# Patient Record
Sex: Male | Born: 1945 | Race: White | Hispanic: No | Smoking: Never smoker
Health system: Southern US, Community
[De-identification: ages and names within clinical notes are randomized; demographics above are authoritative.]

## PROBLEM LIST (undated history)

## (undated) HISTORY — PX: KNEE ARTHROSCOPY: SHX127

---

## 2007-07-25 ENCOUNTER — Ambulatory Visit: Payer: Self-pay | Admitting: Cardiology

## 2009-08-20 ENCOUNTER — Ambulatory Visit (HOSPITAL_COMMUNITY): Admission: RE | Admit: 2009-08-20 | Discharge: 2009-08-20 | Payer: Self-pay | Admitting: Urology

## 2009-08-24 ENCOUNTER — Emergency Department (HOSPITAL_COMMUNITY): Admission: EM | Admit: 2009-08-24 | Discharge: 2009-08-24 | Payer: Self-pay | Admitting: Emergency Medicine

## 2009-08-26 ENCOUNTER — Ambulatory Visit (HOSPITAL_BASED_OUTPATIENT_CLINIC_OR_DEPARTMENT_OTHER): Admission: RE | Admit: 2009-08-26 | Discharge: 2009-08-26 | Payer: Self-pay | Admitting: Urology

## 2009-08-26 ENCOUNTER — Emergency Department (HOSPITAL_COMMUNITY): Admission: EM | Admit: 2009-08-26 | Discharge: 2009-08-26 | Payer: Self-pay | Admitting: Emergency Medicine

## 2010-08-04 LAB — URINALYSIS, ROUTINE W REFLEX MICROSCOPIC
Bilirubin Urine: NEGATIVE
Glucose, UA: NEGATIVE mg/dL
Ketones, ur: NEGATIVE mg/dL
Urobilinogen, UA: 0.2 mg/dL (ref 0.0–1.0)

## 2010-08-04 LAB — POCT I-STAT, CHEM 8
Calcium, Ion: 1.06 mmol/L — ABNORMAL LOW (ref 1.12–1.32)
Glucose, Bld: 116 mg/dL — ABNORMAL HIGH (ref 70–99)
HCT: 47 % (ref 39.0–52.0)
Hemoglobin: 16 g/dL (ref 13.0–17.0)
TCO2: 27 mmol/L (ref 0–100)

## 2010-08-04 LAB — URINE MICROSCOPIC-ADD ON

## 2010-09-16 ENCOUNTER — Emergency Department (HOSPITAL_COMMUNITY): Admission: EM | Admit: 2010-09-16 | Payer: Self-pay | Source: Home / Self Care

## 2011-12-26 DIAGNOSIS — Z23 Encounter for immunization: Secondary | ICD-10-CM | POA: Diagnosis not present

## 2012-02-01 DIAGNOSIS — D239 Other benign neoplasm of skin, unspecified: Secondary | ICD-10-CM | POA: Diagnosis not present

## 2012-02-01 DIAGNOSIS — L821 Other seborrheic keratosis: Secondary | ICD-10-CM | POA: Diagnosis not present

## 2012-02-01 DIAGNOSIS — L570 Actinic keratosis: Secondary | ICD-10-CM | POA: Diagnosis not present

## 2012-03-02 DIAGNOSIS — Z23 Encounter for immunization: Secondary | ICD-10-CM | POA: Diagnosis not present

## 2012-05-23 DIAGNOSIS — L570 Actinic keratosis: Secondary | ICD-10-CM | POA: Diagnosis not present

## 2012-08-08 DIAGNOSIS — L570 Actinic keratosis: Secondary | ICD-10-CM | POA: Diagnosis not present

## 2012-09-13 DIAGNOSIS — H5310 Unspecified subjective visual disturbances: Secondary | ICD-10-CM | POA: Diagnosis not present

## 2012-09-13 DIAGNOSIS — H43819 Vitreous degeneration, unspecified eye: Secondary | ICD-10-CM | POA: Diagnosis not present

## 2012-09-13 DIAGNOSIS — H259 Unspecified age-related cataract: Secondary | ICD-10-CM | POA: Diagnosis not present

## 2012-09-13 DIAGNOSIS — H5210 Myopia, unspecified eye: Secondary | ICD-10-CM | POA: Diagnosis not present

## 2013-02-19 DIAGNOSIS — Z23 Encounter for immunization: Secondary | ICD-10-CM | POA: Diagnosis not present

## 2013-02-22 DIAGNOSIS — G4700 Insomnia, unspecified: Secondary | ICD-10-CM | POA: Diagnosis not present

## 2013-02-22 DIAGNOSIS — F411 Generalized anxiety disorder: Secondary | ICD-10-CM | POA: Diagnosis not present

## 2013-02-22 DIAGNOSIS — Z0000 Encounter for general adult medical examination without abnormal findings: Secondary | ICD-10-CM | POA: Diagnosis not present

## 2013-02-22 DIAGNOSIS — Z1331 Encounter for screening for depression: Secondary | ICD-10-CM | POA: Diagnosis not present

## 2013-02-22 DIAGNOSIS — K3189 Other diseases of stomach and duodenum: Secondary | ICD-10-CM | POA: Diagnosis not present

## 2013-02-22 DIAGNOSIS — Z23 Encounter for immunization: Secondary | ICD-10-CM | POA: Diagnosis not present

## 2013-02-22 DIAGNOSIS — N400 Enlarged prostate without lower urinary tract symptoms: Secondary | ICD-10-CM | POA: Diagnosis not present

## 2013-03-13 DIAGNOSIS — R5381 Other malaise: Secondary | ICD-10-CM | POA: Diagnosis not present

## 2013-04-03 ENCOUNTER — Encounter (INDEPENDENT_AMBULATORY_CARE_PROVIDER_SITE_OTHER): Payer: Self-pay | Admitting: *Deleted

## 2013-04-17 ENCOUNTER — Encounter (INDEPENDENT_AMBULATORY_CARE_PROVIDER_SITE_OTHER): Payer: Self-pay | Admitting: Internal Medicine

## 2013-04-17 ENCOUNTER — Ambulatory Visit (INDEPENDENT_AMBULATORY_CARE_PROVIDER_SITE_OTHER): Payer: Medicare Other | Admitting: Internal Medicine

## 2013-04-17 ENCOUNTER — Other Ambulatory Visit (INDEPENDENT_AMBULATORY_CARE_PROVIDER_SITE_OTHER): Payer: Self-pay | Admitting: *Deleted

## 2013-04-17 ENCOUNTER — Encounter (INDEPENDENT_AMBULATORY_CARE_PROVIDER_SITE_OTHER): Payer: Self-pay | Admitting: *Deleted

## 2013-04-17 VITALS — BP 118/62 | HR 70 | Temp 98.0°F | Ht 70.0 in | Wt 167.8 lb

## 2013-04-17 DIAGNOSIS — K219 Gastro-esophageal reflux disease without esophagitis: Secondary | ICD-10-CM | POA: Diagnosis not present

## 2013-04-17 NOTE — Patient Instructions (Signed)
EGD with Dr. Karilyn Cota, The risks and benefits such as perforation, bleeding, and infection were reviewed with the patient and is agreeable.

## 2013-04-17 NOTE — Progress Notes (Signed)
Subjective:     Patient ID: Shawn Bullock, male   DOB: 12-10-1945, 67 y.o.   MRN: [id number]  HPI Referred to our our office by Dr. Neita Carp for uncontrolled GERD. He tells me about 25 yrs ago he says he had an ulcer diagnosed on an EGD in [place] by ? Dr Jarold Motto.- Around the 1st of September he started having a gnawing feeling in his epigastric region. He started taking prilosec. He also added the antacid. His symptoms are better. He still has the symptoms. It does not occur when he is sleeping. Occurred just before lunch today. He has more belching. Appetite is good. No weight loss. No dysphagia. No abdominal pain, just an emptying feeling. BMs are regular. No melena or bright rectal bleeding.   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  History reviewed. No pertinent past medical history.\ Past Surgical History  Procedure Laterality Date  . Knee arthroscopy      8-9 yrs ago   No Known Allergies History reviewed. No pertinent past medical history.     Objective:   Physical Exam  Filed Vitals:   04/17/13 1444  BP: 118/62  Pulse: 70  Temp: 98 F (36.7 C)  Height: 5\' 10"  (1.778 m)  Weight: 167 lb 12.8 oz (76.114 kg)   Alert and oriented. Skin warm and dry. Oral mucosa is moist.   . Sclera anicteric, conjunctivae is pink. Thyroid not enlarged. No cervical lymphadenopathy. Lungs clear. Heart regular rate and rhythm.  Abdomen is soft. Bowel sounds are positive. No hepatomegaly. No abdominal masses felt. Slight tenderness to epigastric region.  No edema to lower extremities.       Assessment:    Epigastric tenderness. Genella Rife. He has a gnawing sensation in his epigastric region. PUD needs to be ruled out.     Plan:    EGD with Dr.[name]. The risks and benefits such as perforation, bleeding, and infection were reviewed with the patient and is agreeable.   Dexilant samples x 3 boxes given to patient.

## 2013-04-22 DIAGNOSIS — L988 Other specified disorders of the skin and subcutaneous tissue: Secondary | ICD-10-CM | POA: Diagnosis not present

## 2013-04-22 DIAGNOSIS — L570 Actinic keratosis: Secondary | ICD-10-CM | POA: Diagnosis not present

## 2013-04-22 DIAGNOSIS — D485 Neoplasm of uncertain behavior of skin: Secondary | ICD-10-CM | POA: Diagnosis not present

## 2013-04-24 ENCOUNTER — Encounter (HOSPITAL_COMMUNITY): Payer: Self-pay | Admitting: Pharmacy Technician

## 2013-05-03 ENCOUNTER — Encounter (HOSPITAL_COMMUNITY): Payer: Self-pay | Admitting: *Deleted

## 2013-05-03 ENCOUNTER — Encounter (HOSPITAL_COMMUNITY)
Admission: RE | Disposition: A | Discharge status: Home / Self Care | Payer: Self-pay | Source: Ambulatory Visit | Attending: Internal Medicine

## 2013-05-03 ENCOUNTER — Ambulatory Visit (HOSPITAL_COMMUNITY)
Admission: RE | Admit: 2013-05-03 | Discharge: 2013-05-03 | Disposition: A | Discharge status: Home / Self Care | Payer: Medicare Other | Source: Ambulatory Visit | Attending: Internal Medicine | Admitting: Internal Medicine

## 2013-05-03 DIAGNOSIS — Z8711 Personal history of peptic ulcer disease: Secondary | ICD-10-CM

## 2013-05-03 DIAGNOSIS — R12 Heartburn: Secondary | ICD-10-CM

## 2013-05-03 DIAGNOSIS — K2270 Barrett's esophagus without dysplasia: Secondary | ICD-10-CM | POA: Insufficient documentation

## 2013-05-03 DIAGNOSIS — K449 Diaphragmatic hernia without obstruction or gangrene: Secondary | ICD-10-CM | POA: Insufficient documentation

## 2013-05-03 DIAGNOSIS — R1013 Epigastric pain: Secondary | ICD-10-CM | POA: Insufficient documentation

## 2013-05-03 DIAGNOSIS — K219 Gastro-esophageal reflux disease without esophagitis: Secondary | ICD-10-CM

## 2013-05-03 HISTORY — PX: ESOPHAGOGASTRODUODENOSCOPY: SHX5428

## 2013-05-03 SURGERY — EGD (ESOPHAGOGASTRODUODENOSCOPY)
Anesthesia: Moderate Sedation

## 2013-05-03 MED ORDER — SUCRALFATE 1 G PO TABS: 1.0000 g | ORAL_TABLET | Freq: Every day | ORAL | Status: DC

## 2013-05-03 MED ORDER — MIDAZOLAM HCL 5 MG/5ML IJ SOLN
INTRAMUSCULAR | Status: DC | PRN
  Administered 2013-05-03 (×4): 2 mg via INTRAVENOUS

## 2013-05-03 MED ORDER — STERILE WATER FOR IRRIGATION IR SOLN
Status: DC | PRN
  Administered 2013-05-03: 15:00:00

## 2013-05-03 MED ORDER — SODIUM CHLORIDE 0.9 % IV SOLN
INTRAVENOUS | Status: DC
  Administered 2013-05-03: 1000 mL via INTRAVENOUS

## 2013-05-03 MED ORDER — MEPERIDINE HCL 50 MG/ML IJ SOLN
INTRAMUSCULAR | Status: DC | PRN
  Administered 2013-05-03 (×2): 25 mg

## 2013-05-03 MED ORDER — MIDAZOLAM HCL 5 MG/5ML IJ SOLN
INTRAMUSCULAR | Status: AC
  Filled 2013-05-03: qty 10

## 2013-05-03 MED ORDER — ESOMEPRAZOLE MAGNESIUM 40 MG PO CPDR: 40.0000 mg | DELAYED_RELEASE_CAPSULE | Freq: Every day | ORAL | Status: DC

## 2013-05-03 MED ORDER — MEPERIDINE HCL 50 MG/ML IJ SOLN
INTRAMUSCULAR | Status: AC
  Filled 2013-05-03: qty 1

## 2013-05-03 MED ORDER — BUTAMBEN-TETRACAINE-BENZOCAINE 2-2-14 % EX AERO
INHALATION_SPRAY | CUTANEOUS | Status: DC | PRN
  Administered 2013-05-03: 2 via TOPICAL

## 2013-05-03 NOTE — Op Note (Addendum)
EGD PROCEDURE REPORT  PATIENT:  Shawn Bullock  MR#:  [id number] Birthdate:  Jun 01, 1945, 67 y.o., male Endoscopist:  Dr. Malissa Hippo, MD Referred By:  Dr. Bonnetta Barry ref. provider found Procedure Date: 05/03/2013  Procedure:   EGD  Indications:  Patient is 67 year old Caucasian male who presents with a two-month history of intermittent epigastric pain. Pain started when he was under a lot of stress. He has chronic nocturnal regurgitation and heartburn. He denies nausea vomiting anorexia abdominal pain or melena. He has been with 2 different PPIs but does not feel better. He has remote history of peptic ulcer disease. Recent H. pylori serology was negative. He is undergoing diagnostic EGD.            Informed Consent:  The risks, benefits, alternatives & imponderables which include, but are not limited to, bleeding, infection, perforation, drug reaction and potential missed lesion have been reviewed.  The potential for biopsy, lesion removal, esophageal dilation, etc. have also been discussed.  Questions have been answered.  All parties agreeable.  Please see history & physical in medical record for more information.  Medications:  Demerol 50 mg IV Versed 8 mg IV Cetacaine spray topically for oropharyngeal anesthesia  Description of procedure:  The endoscope was introduced through the mouth and advanced to the second portion of the duodenum without difficulty or limitations. The mucosal surfaces were surveyed very carefully during advancement of the scope and upon withdrawal.  Findings:  Esophagus:  Mucosa of the esophagus was normal. The GE junction was wavy 2 small islands of gastric type mucosa above the GE junction. No ring or stricture noted. GEJ:  39 cm Hiatus:  41 cm Stomach:  Stomach was empty and distended very well with insufflation. Folds in the proximal stomach were normal. Examination of mucosa at gastric body, antrum, pyloric channel, angularis, fundus and cardia was  normal. Duodenum:  Normal bulbar and post bulbar mucosa.   Therapeutic/Diagnostic Maneuvers Performed:   Biopsy was taken from the GE junction to rule out short segment Barrett's.  Complications:  None  Impression: Small sliding hiatal hernia with wavy GE junction and two small islands of gastric type mucosa proximal to GE junction. Biopsy taken. No evidence of peptic ulcer disease.  Recommendations:  Nexium 40 mg by mouth daily. Sucralfate 2 g by mouth each bedtime. I will be contacting patient with results of biopsy and further recommendations.  [name],[name]  05/03/2013  3:59 PM  CC: Dr. Estanislado Pandy, MD & Dr. Bonnetta Barry ref. provider found

## 2013-05-03 NOTE — H&P (Signed)
Shawn Bullock is an 67 y.o. male.   Chief Complaint: Patient's here for EGD. HPI: Patient is a 67 year old Caucasian male who has remote history of peptic ulcer disease who presents with nausea and epigastric pain over the last several weeks. He took Prilosec for over a month without any relief. He feels Maalox maybe helping. He has nocturnal regurgitation sporadically. He denies dysphagia nausea vomiting melena or rectal bleeding. He does not take any NSAIDs. H. pylori serology was negative.  History reviewed. No pertinent past medical history.  Past Surgical History  Procedure Laterality Date  . Knee arthroscopy      8-9 yrs ago    History reviewed. No pertinent family history. Social History:  reports that he has never smoked. He does not have any smokeless tobacco history on file. He reports that he drinks alcohol. He reports that he does not use illicit drugs.  Allergies: No Known Allergies  Medications Prior to Admission  Medication Sig Dispense Refill  . alum & mag hydroxide-simeth (MAALOX/MYLANTA) 200-200-20 MG/5ML suspension Take by mouth every 6 (six) hours as needed for indigestion or heartburn.      . Flaxseed, Linseed, (FLAXSEED OIL PO) Take 1 capsule by mouth 2 (two) times daily.       . Multiple Vitamins-Minerals (MULTIVITAMIN PO) Take by mouth.      . Omega-3 Fatty Acids (FISH OIL) 1000 MG CPDR Take 2 capsules by mouth daily.       . Red Yeast Rice Extract (RED YEAST RICE PO) Take 1 capsule by mouth 2 (two) times daily.       Marland Kitchen dexlansoprazole (DEXILANT) 60 MG capsule Take 60 mg by mouth daily.        No results found for this or any previous visit (from the past 48 hour(s)). No results found.  ROS  Blood pressure 138/87, pulse 82, temperature 98 F (36.7 C), temperature source Oral, resp. rate 20, SpO2 98.00%. Physical Exam  Constitutional: He appears well-developed and well-nourished.  HENT:  Mouth/Throat: Oropharynx is clear and moist.  Eyes: Conjunctivae  are normal. No scleral icterus.  Neck: No thyromegaly present.  Cardiovascular: Normal rate, regular rhythm and normal heart sounds.   No murmur heard. Respiratory: Effort normal and breath sounds normal.  GI: Soft. He exhibits no distension and no mass. There is no tenderness.  Musculoskeletal: He exhibits no edema.  Lymphadenopathy:    He has no cervical adenopathy.  Neurological: He is alert.  Skin: Skin is warm and dry.     Assessment/Plan Epigastric pain unresponsive to PPI. Remote history of peptic ulcer disease. Diagnostic EGD.  [name],[name] 05/03/2013, 3:24 PM

## 2013-05-07 ENCOUNTER — Encounter (HOSPITAL_COMMUNITY): Payer: Self-pay | Admitting: Internal Medicine

## 2013-05-13 ENCOUNTER — Encounter (INDEPENDENT_AMBULATORY_CARE_PROVIDER_SITE_OTHER): Payer: Self-pay | Admitting: *Deleted

## 2013-05-13 ENCOUNTER — Other Ambulatory Visit (INDEPENDENT_AMBULATORY_CARE_PROVIDER_SITE_OTHER): Payer: Self-pay | Admitting: Internal Medicine

## 2013-05-13 ENCOUNTER — Telehealth (INDEPENDENT_AMBULATORY_CARE_PROVIDER_SITE_OTHER): Payer: Self-pay | Admitting: *Deleted

## 2013-05-13 DIAGNOSIS — Z0189 Encounter for other specified special examinations: Secondary | ICD-10-CM

## 2013-05-13 DIAGNOSIS — G8929 Other chronic pain: Secondary | ICD-10-CM

## 2013-05-13 NOTE — Telephone Encounter (Signed)
Patient is having CT A/P 05/17/13 and needs order for creatinine sent to [facility] lab

## 2013-05-14 DIAGNOSIS — Z0189 Encounter for other specified special examinations: Secondary | ICD-10-CM | POA: Diagnosis not present

## 2013-05-14 LAB — CREATININE, SERUM: Creat: 0.79 mg/dL (ref 0.50–1.35)

## 2013-05-14 NOTE — Telephone Encounter (Signed)
Lab order faxed to [facility].

## 2013-05-14 NOTE — Telephone Encounter (Signed)
Per Dr.[name] the patient will need to have labs drawn prior to diagnostic.

## 2013-05-15 ENCOUNTER — Encounter (INDEPENDENT_AMBULATORY_CARE_PROVIDER_SITE_OTHER): Payer: Self-pay | Admitting: Internal Medicine

## 2013-05-15 ENCOUNTER — Ambulatory Visit (HOSPITAL_COMMUNITY)
Admission: RE | Admit: 2013-05-15 | Discharge: 2013-05-15 | Disposition: A | Discharge status: Home / Self Care | Payer: Medicare Other | Source: Ambulatory Visit | Attending: Internal Medicine | Admitting: Internal Medicine

## 2013-05-15 DIAGNOSIS — G8929 Other chronic pain: Secondary | ICD-10-CM

## 2013-05-15 DIAGNOSIS — R1013 Epigastric pain: Secondary | ICD-10-CM | POA: Insufficient documentation

## 2013-05-15 MED ORDER — IOHEXOL 300 MG/ML  SOLN
100.0000 mL | Freq: Once | INTRAMUSCULAR | Status: AC | PRN
  Administered 2013-05-15: 100 mL via INTRAVENOUS

## 2013-05-17 ENCOUNTER — Telehealth (INDEPENDENT_AMBULATORY_CARE_PROVIDER_SITE_OTHER): Payer: Self-pay | Admitting: Internal Medicine

## 2013-05-17 ENCOUNTER — Other Ambulatory Visit (HOSPITAL_COMMUNITY): Payer: Medicare Other

## 2013-05-17 NOTE — Telephone Encounter (Signed)
Results given to patient. Dr. Laural Golden will call Saturday. I talked with Dr. Laural Golden before I called patient.

## 2013-05-19 ENCOUNTER — Encounter (INDEPENDENT_AMBULATORY_CARE_PROVIDER_SITE_OTHER): Payer: Self-pay | Admitting: Internal Medicine

## 2013-05-20 ENCOUNTER — Other Ambulatory Visit (INDEPENDENT_AMBULATORY_CARE_PROVIDER_SITE_OTHER): Payer: Self-pay | Admitting: Internal Medicine

## 2013-05-20 MED ORDER — HYOSCYAMINE SULFATE 0.125 MG SL SUBL: 0.1250 mg | SUBLINGUAL_TABLET | Freq: Four times a day (QID) | SUBLINGUAL | Status: DC | PRN

## 2013-06-18 ENCOUNTER — Encounter (INDEPENDENT_AMBULATORY_CARE_PROVIDER_SITE_OTHER): Payer: Self-pay | Admitting: Internal Medicine

## 2014-02-01 DIAGNOSIS — T1510XA Foreign body in conjunctival sac, unspecified eye, initial encounter: Secondary | ICD-10-CM | POA: Diagnosis not present

## 2014-02-21 IMAGING — CT CT ABD-PELV W/ CM
2 of 5 series · 16 of 46 positions shown, 18 images · IV contrast (Omnipaque 300)
Comparison: None.

CLINICAL DATA: Epigastric pain

EXAM:
CT ABDOMEN AND PELVIS WITH CONTRAST
TECHNIQUE: Multidetector CT imaging of the abdomen and pelvis was performed
using the standard protocol following bolus administration of
intravenous contrast.
CONTRAST:  100mL OMNIPAQUE IOHEXOL 300 MG/ML  SOLN

[Series 2: abd_pel_with 5.0 b40f · axial · 0.65mm/px · z∈[-511,-131]mm · 13 of 88 slices shown, 15 images]
[im 6/88  soft-tissue]
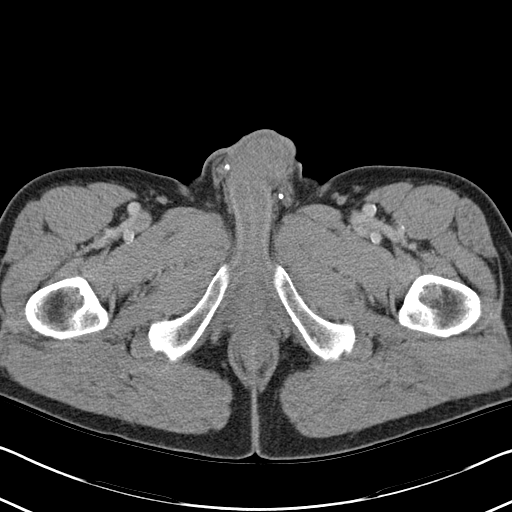
[im 6/88  bone]
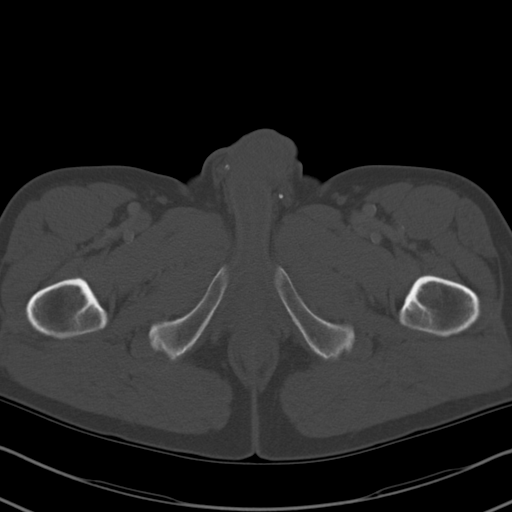
[im 11/88  soft-tissue]
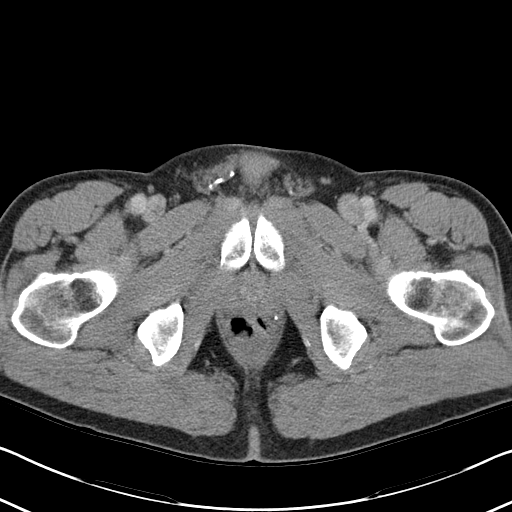
[im 21/88  soft-tissue]
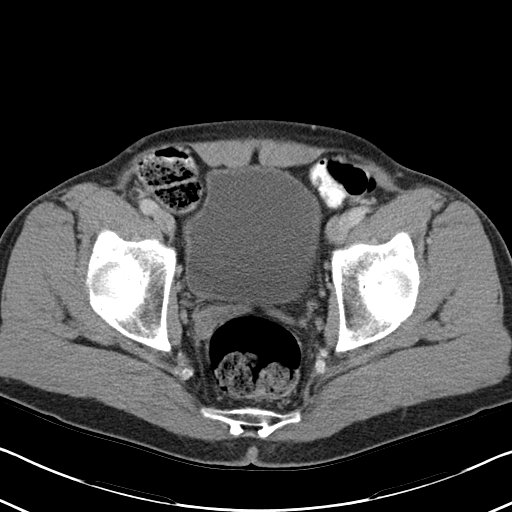
[im 26/88  soft-tissue]
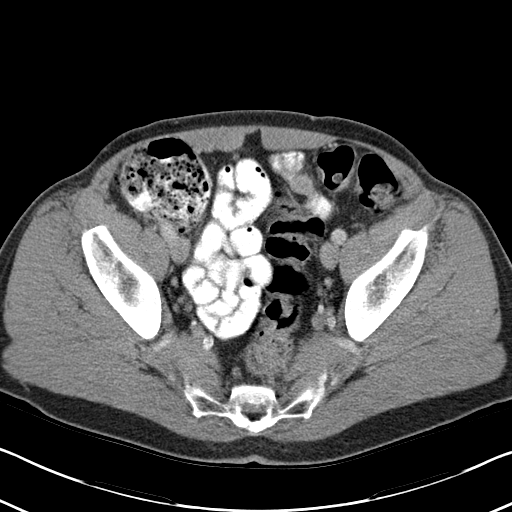
[im 31/88  soft-tissue]
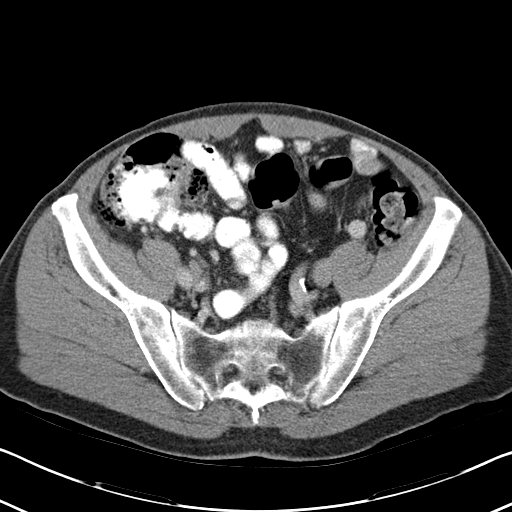
[im 36/88  soft-tissue]
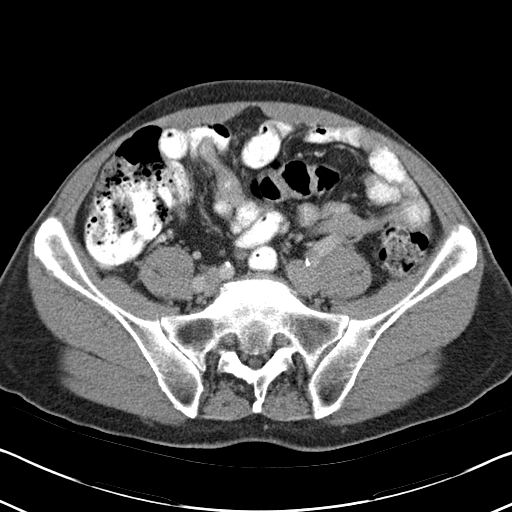
[im 47/88  soft-tissue]
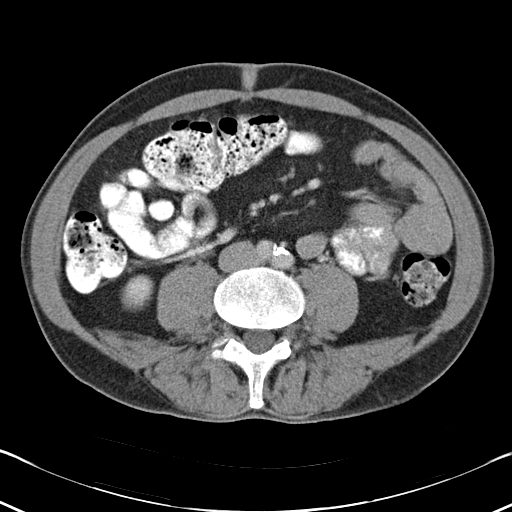
[im 52/88  soft-tissue]
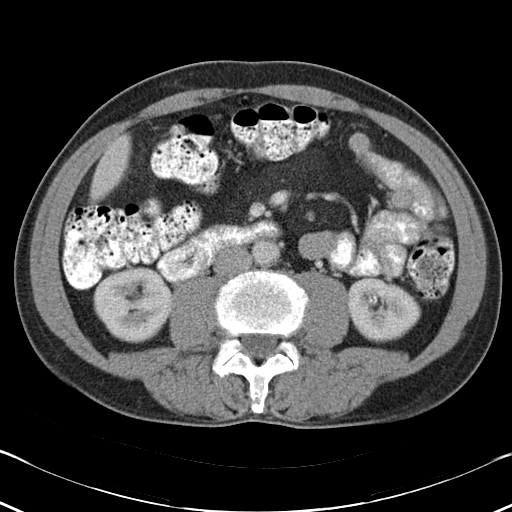
[im 57/88  soft-tissue]
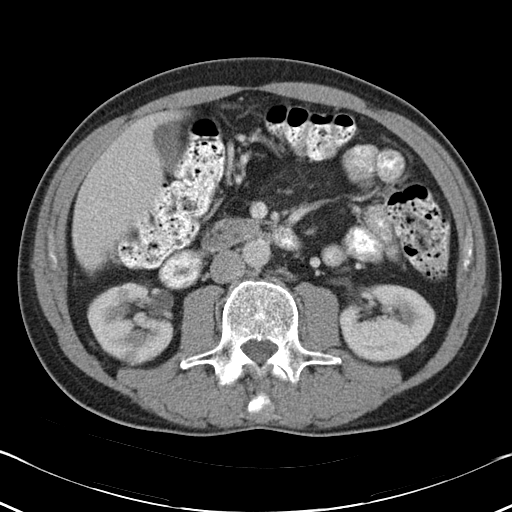
[im 57/88  bone]
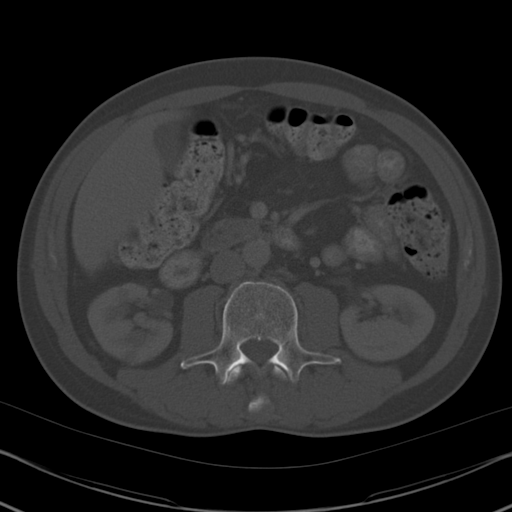
[im 62/88  soft-tissue]
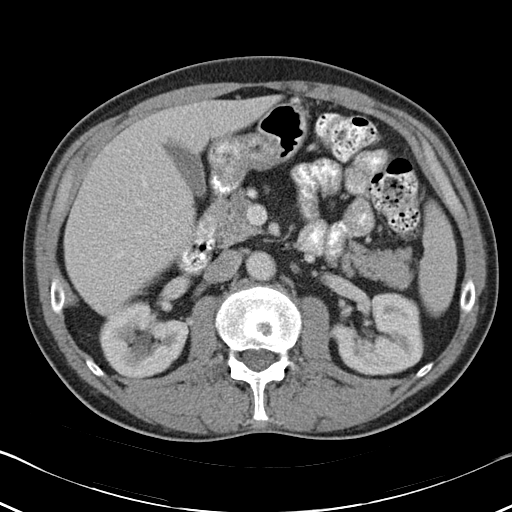
[im 67/88  soft-tissue]
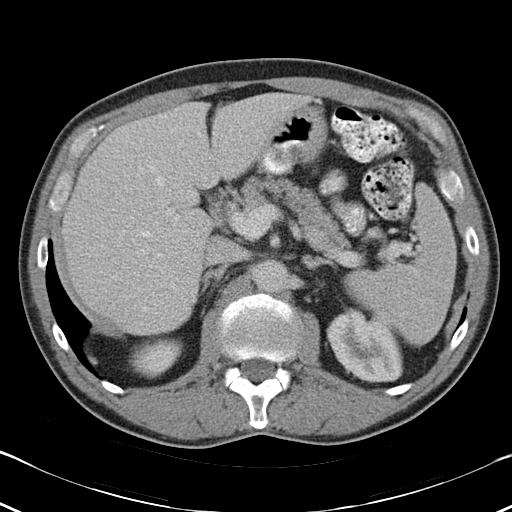
[im 77/88  soft-tissue]
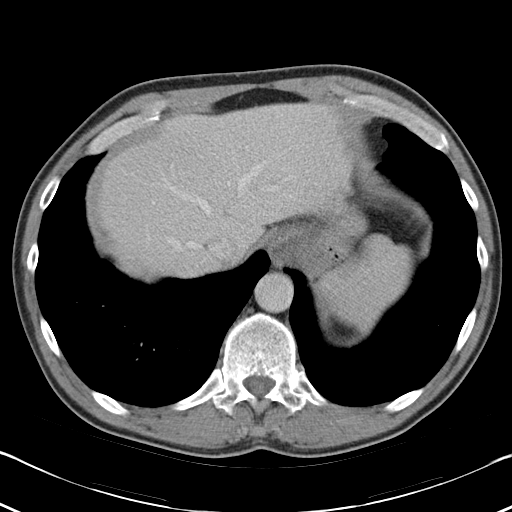
[im 82/88  soft-tissue]
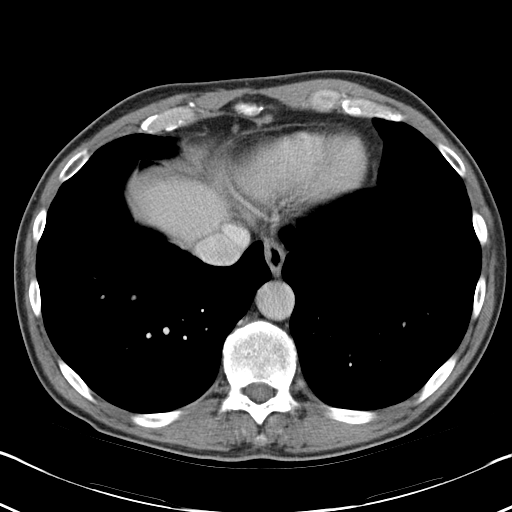

[Series 4: abd_pel_with 3.0 spo cor · coronal · 0.68mm/px · 3 of 82 slices shown]
[im 28/82  soft-tissue]
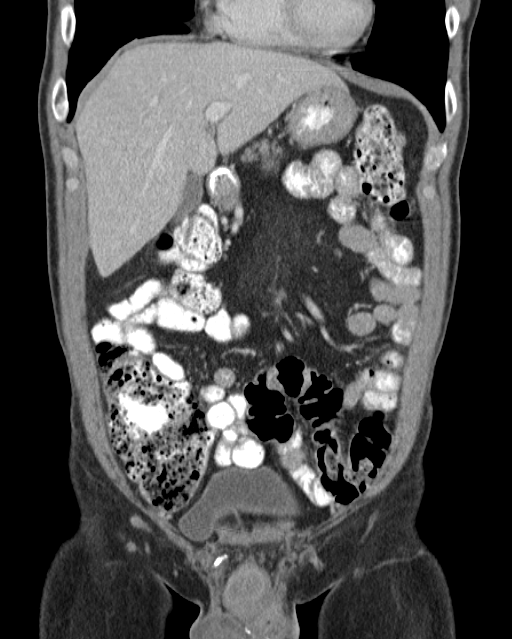
[im 37/82  soft-tissue]
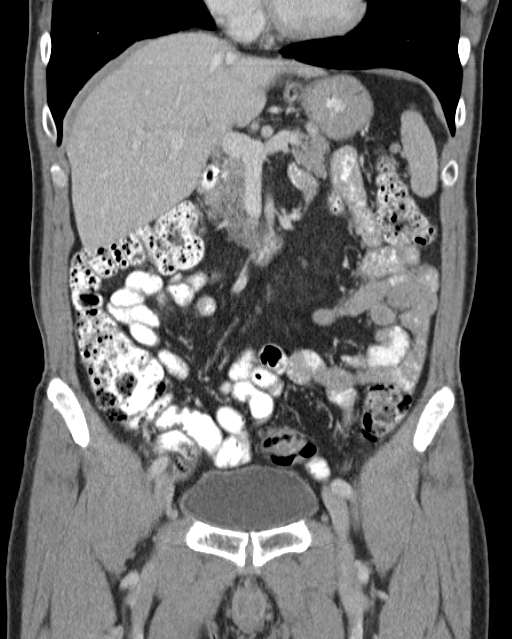
[im 46/82  soft-tissue]
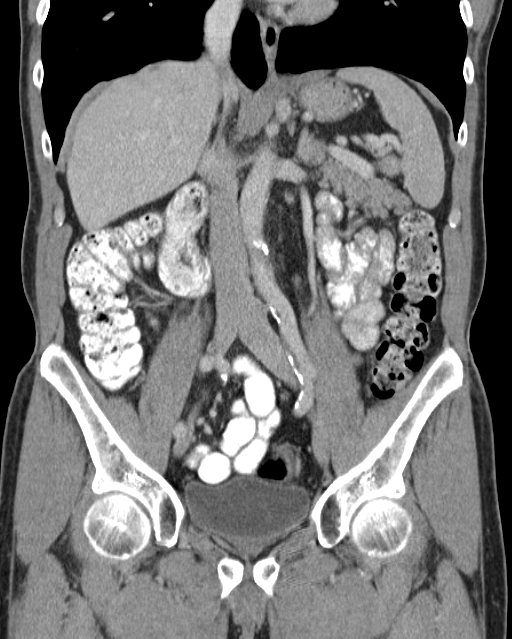

[16 of 46 positions shown; findings below may reference images not displayed]

FINDINGS: The lung bases are clear.

The liver demonstrates no focal abnormality. There is no
intrahepatic or extrahepatic biliary ductal dilatation. The
gallbladder is normal. The spleen demonstrates no focal abnormality.
There is a 14 mm hypodensity in the posterior interpolar aspect of
the right kidney measuring fluid attenuation most consistent with a
cyst. The left kidney, adrenal glands and pancreas are normal. The
bladder is unremarkable.

The stomach, duodenum, small intestine, and large intestine
demonstrate no contrast extravasation or dilatation. There is no
pneumoperitoneum, pneumatosis, or portal venous gas. There is no
abdominal or pelvic free fluid. There is no lymphadenopathy.

The abdominal aorta is normal in caliber with atherosclerosis.

There are no lytic or sclerotic osseous lesions. There is severe
degenerative disc disease at L5-S1 with a broad-based disc
osteophyte complex.
IMPRESSION: No acute abdominal or pelvic pathology.

## 2014-03-05 DIAGNOSIS — Z23 Encounter for immunization: Secondary | ICD-10-CM | POA: Diagnosis not present

## 2014-04-02 ENCOUNTER — Other Ambulatory Visit: Payer: Self-pay | Admitting: Dermatology

## 2014-04-02 DIAGNOSIS — L820 Inflamed seborrheic keratosis: Secondary | ICD-10-CM | POA: Diagnosis not present

## 2014-04-02 DIAGNOSIS — L570 Actinic keratosis: Secondary | ICD-10-CM | POA: Diagnosis not present

## 2014-04-02 DIAGNOSIS — D485 Neoplasm of uncertain behavior of skin: Secondary | ICD-10-CM | POA: Diagnosis not present

## 2014-04-09 DIAGNOSIS — J209 Acute bronchitis, unspecified: Secondary | ICD-10-CM | POA: Diagnosis not present

## 2014-04-09 DIAGNOSIS — J069 Acute upper respiratory infection, unspecified: Secondary | ICD-10-CM | POA: Diagnosis not present

## 2014-05-12 DIAGNOSIS — L570 Actinic keratosis: Secondary | ICD-10-CM | POA: Diagnosis not present

## 2014-06-09 DIAGNOSIS — L570 Actinic keratosis: Secondary | ICD-10-CM | POA: Diagnosis not present

## 2014-12-08 DIAGNOSIS — J209 Acute bronchitis, unspecified: Secondary | ICD-10-CM | POA: Diagnosis not present

## 2015-03-11 DIAGNOSIS — Z23 Encounter for immunization: Secondary | ICD-10-CM | POA: Diagnosis not present

## 2015-03-30 DIAGNOSIS — H43813 Vitreous degeneration, bilateral: Secondary | ICD-10-CM | POA: Diagnosis not present

## 2015-03-30 DIAGNOSIS — H52203 Unspecified astigmatism, bilateral: Secondary | ICD-10-CM | POA: Diagnosis not present

## 2015-03-30 DIAGNOSIS — H25813 Combined forms of age-related cataract, bilateral: Secondary | ICD-10-CM | POA: Diagnosis not present

## 2015-03-30 DIAGNOSIS — H01001 Unspecified blepharitis right upper eyelid: Secondary | ICD-10-CM | POA: Diagnosis not present

## 2015-04-01 DIAGNOSIS — L570 Actinic keratosis: Secondary | ICD-10-CM | POA: Diagnosis not present

## 2015-04-01 DIAGNOSIS — D239 Other benign neoplasm of skin, unspecified: Secondary | ICD-10-CM | POA: Diagnosis not present

## 2015-04-08 DIAGNOSIS — Z23 Encounter for immunization: Secondary | ICD-10-CM | POA: Diagnosis not present

## 2015-04-08 DIAGNOSIS — Z0000 Encounter for general adult medical examination without abnormal findings: Secondary | ICD-10-CM | POA: Diagnosis not present

## 2015-04-08 DIAGNOSIS — Z1389 Encounter for screening for other disorder: Secondary | ICD-10-CM | POA: Diagnosis not present

## 2015-04-29 DIAGNOSIS — L570 Actinic keratosis: Secondary | ICD-10-CM | POA: Diagnosis not present

## 2015-06-04 DIAGNOSIS — L570 Actinic keratosis: Secondary | ICD-10-CM | POA: Diagnosis not present

## 2015-07-15 DIAGNOSIS — L570 Actinic keratosis: Secondary | ICD-10-CM | POA: Diagnosis not present

## 2015-08-10 DIAGNOSIS — J209 Acute bronchitis, unspecified: Secondary | ICD-10-CM | POA: Diagnosis not present

## 2015-08-19 DIAGNOSIS — L570 Actinic keratosis: Secondary | ICD-10-CM | POA: Diagnosis not present

## 2015-11-09 ENCOUNTER — Other Ambulatory Visit: Payer: Self-pay | Admitting: Dermatology

## 2015-11-09 DIAGNOSIS — C4442 Squamous cell carcinoma of skin of scalp and neck: Secondary | ICD-10-CM | POA: Diagnosis not present

## 2015-11-09 DIAGNOSIS — C4492 Squamous cell carcinoma of skin, unspecified: Secondary | ICD-10-CM

## 2015-11-09 DIAGNOSIS — L820 Inflamed seborrheic keratosis: Secondary | ICD-10-CM | POA: Diagnosis not present

## 2015-11-09 DIAGNOSIS — D485 Neoplasm of uncertain behavior of skin: Secondary | ICD-10-CM | POA: Diagnosis not present

## 2015-11-09 HISTORY — DX: Squamous cell carcinoma of skin, unspecified: C44.92

## 2015-12-01 DIAGNOSIS — C4442 Squamous cell carcinoma of skin of scalp and neck: Secondary | ICD-10-CM | POA: Diagnosis not present

## 2015-12-24 DIAGNOSIS — S83241A Other tear of medial meniscus, current injury, right knee, initial encounter: Secondary | ICD-10-CM | POA: Diagnosis not present

## 2015-12-24 DIAGNOSIS — M1711 Unilateral primary osteoarthritis, right knee: Secondary | ICD-10-CM | POA: Diagnosis not present

## 2016-01-11 DIAGNOSIS — M25561 Pain in right knee: Secondary | ICD-10-CM | POA: Diagnosis not present

## 2016-01-12 DIAGNOSIS — S83241D Other tear of medial meniscus, current injury, right knee, subsequent encounter: Secondary | ICD-10-CM | POA: Diagnosis not present

## 2016-01-12 DIAGNOSIS — M1711 Unilateral primary osteoarthritis, right knee: Secondary | ICD-10-CM | POA: Diagnosis not present

## 2016-01-20 DIAGNOSIS — M1711 Unilateral primary osteoarthritis, right knee: Secondary | ICD-10-CM | POA: Diagnosis not present

## 2016-01-20 DIAGNOSIS — Z6824 Body mass index (BMI) 24.0-24.9, adult: Secondary | ICD-10-CM | POA: Diagnosis not present

## 2016-02-10 DIAGNOSIS — S83241A Other tear of medial meniscus, current injury, right knee, initial encounter: Secondary | ICD-10-CM | POA: Diagnosis not present

## 2016-02-10 DIAGNOSIS — G8918 Other acute postprocedural pain: Secondary | ICD-10-CM | POA: Diagnosis not present

## 2016-02-10 DIAGNOSIS — Y999 Unspecified external cause status: Secondary | ICD-10-CM | POA: Diagnosis not present

## 2016-02-10 DIAGNOSIS — S83281A Other tear of lateral meniscus, current injury, right knee, initial encounter: Secondary | ICD-10-CM | POA: Diagnosis not present

## 2016-02-10 DIAGNOSIS — S83231A Complex tear of medial meniscus, current injury, right knee, initial encounter: Secondary | ICD-10-CM | POA: Diagnosis not present

## 2016-02-10 DIAGNOSIS — M94261 Chondromalacia, right knee: Secondary | ICD-10-CM | POA: Diagnosis not present

## 2016-02-16 DIAGNOSIS — S83241D Other tear of medial meniscus, current injury, right knee, subsequent encounter: Secondary | ICD-10-CM | POA: Diagnosis not present

## 2016-03-14 DIAGNOSIS — Z23 Encounter for immunization: Secondary | ICD-10-CM | POA: Diagnosis not present

## 2016-03-14 DIAGNOSIS — L570 Actinic keratosis: Secondary | ICD-10-CM | POA: Diagnosis not present

## 2016-03-15 DIAGNOSIS — S83281D Other tear of lateral meniscus, current injury, right knee, subsequent encounter: Secondary | ICD-10-CM | POA: Diagnosis not present

## 2016-03-15 DIAGNOSIS — S83241D Other tear of medial meniscus, current injury, right knee, subsequent encounter: Secondary | ICD-10-CM | POA: Diagnosis not present

## 2016-04-06 DIAGNOSIS — Z8349 Family history of other endocrine, nutritional and metabolic diseases: Secondary | ICD-10-CM | POA: Diagnosis not present

## 2016-04-06 DIAGNOSIS — M1711 Unilateral primary osteoarthritis, right knee: Secondary | ICD-10-CM | POA: Diagnosis not present

## 2016-04-06 DIAGNOSIS — F411 Generalized anxiety disorder: Secondary | ICD-10-CM | POA: Diagnosis not present

## 2016-04-06 DIAGNOSIS — K210 Gastro-esophageal reflux disease with esophagitis: Secondary | ICD-10-CM | POA: Diagnosis not present

## 2016-04-06 DIAGNOSIS — Z13220 Encounter for screening for lipoid disorders: Secondary | ICD-10-CM | POA: Diagnosis not present

## 2016-04-06 DIAGNOSIS — G4700 Insomnia, unspecified: Secondary | ICD-10-CM | POA: Diagnosis not present

## 2016-04-06 DIAGNOSIS — R5382 Chronic fatigue, unspecified: Secondary | ICD-10-CM | POA: Diagnosis not present

## 2016-04-13 ENCOUNTER — Encounter (INDEPENDENT_AMBULATORY_CARE_PROVIDER_SITE_OTHER): Payer: Self-pay

## 2016-04-13 ENCOUNTER — Encounter (INDEPENDENT_AMBULATORY_CARE_PROVIDER_SITE_OTHER): Payer: Self-pay | Admitting: *Deleted

## 2016-04-13 DIAGNOSIS — L570 Actinic keratosis: Secondary | ICD-10-CM | POA: Diagnosis not present

## 2016-04-13 DIAGNOSIS — M1711 Unilateral primary osteoarthritis, right knee: Secondary | ICD-10-CM | POA: Diagnosis not present

## 2016-04-13 DIAGNOSIS — Z1389 Encounter for screening for other disorder: Secondary | ICD-10-CM | POA: Diagnosis not present

## 2016-04-13 DIAGNOSIS — Z0001 Encounter for general adult medical examination with abnormal findings: Secondary | ICD-10-CM | POA: Diagnosis not present

## 2016-04-13 DIAGNOSIS — Z6824 Body mass index (BMI) 24.0-24.9, adult: Secondary | ICD-10-CM | POA: Diagnosis not present

## 2016-04-19 ENCOUNTER — Other Ambulatory Visit (INDEPENDENT_AMBULATORY_CARE_PROVIDER_SITE_OTHER): Payer: Self-pay | Admitting: *Deleted

## 2016-04-19 DIAGNOSIS — Z800 Family history of malignant neoplasm of digestive organs: Secondary | ICD-10-CM | POA: Insufficient documentation

## 2016-04-19 DIAGNOSIS — Z86010 Personal history of colonic polyps: Secondary | ICD-10-CM

## 2016-04-22 DIAGNOSIS — Z205 Contact with and (suspected) exposure to viral hepatitis: Secondary | ICD-10-CM | POA: Diagnosis not present

## 2016-04-26 DIAGNOSIS — S83241D Other tear of medial meniscus, current injury, right knee, subsequent encounter: Secondary | ICD-10-CM | POA: Diagnosis not present

## 2016-05-19 ENCOUNTER — Encounter (INDEPENDENT_AMBULATORY_CARE_PROVIDER_SITE_OTHER): Payer: Self-pay | Admitting: *Deleted

## 2016-05-19 ENCOUNTER — Telehealth (INDEPENDENT_AMBULATORY_CARE_PROVIDER_SITE_OTHER): Payer: Self-pay | Admitting: *Deleted

## 2016-05-19 MED ORDER — PEG 3350-KCL-NA BICARB-NACL 420 G PO SOLR: 4000.0000 mL | Freq: Once | ORAL | 0 refills | Status: AC

## 2016-05-19 NOTE — Telephone Encounter (Signed)
Patient needs trilyte 

## 2016-06-03 ENCOUNTER — Other Ambulatory Visit: Payer: Self-pay | Admitting: Dermatology

## 2016-06-03 DIAGNOSIS — L905 Scar conditions and fibrosis of skin: Secondary | ICD-10-CM | POA: Diagnosis not present

## 2016-06-03 DIAGNOSIS — L570 Actinic keratosis: Secondary | ICD-10-CM | POA: Diagnosis not present

## 2016-06-03 DIAGNOSIS — D492 Neoplasm of unspecified behavior of bone, soft tissue, and skin: Secondary | ICD-10-CM | POA: Diagnosis not present

## 2016-06-08 ENCOUNTER — Telehealth (INDEPENDENT_AMBULATORY_CARE_PROVIDER_SITE_OTHER): Payer: Self-pay | Admitting: *Deleted

## 2016-06-08 NOTE — Telephone Encounter (Signed)
Referring MD/PCP: sasser    Procedure: tcs  Reason/Indication:  Hx polyps, fam hx colon ca  Has patient had this procedure before?  Yes, 2010 -- scanned  If so, when, by whom and where?    Is there a family history of colon cancer?  Yes, aunt  Who?  What age when diagnosed?    Is patient diabetic?   no      Does patient have prosthetic heart valve or mechanical valve?  no  Do you have a pacemaker?  no  Has patient ever had endocarditis? no  Has patient had joint replacement within last 12 months?  no  Does patient tend to be constipated or take laxatives? no  Does patient have a history of alcohol/drug use?  no  Is patient on Coumadin, Plavix and/or Aspirin? no  Medications: one a day vit, krill oil, red yeast rice  Allergies: nkda  Medication Adjustment:   Procedure date & time: 07/06/16 at 730

## 2016-06-09 NOTE — Telephone Encounter (Signed)
agree

## 2016-07-06 ENCOUNTER — Encounter (HOSPITAL_COMMUNITY): Payer: Self-pay

## 2016-07-06 ENCOUNTER — Encounter (HOSPITAL_COMMUNITY)
Admission: RE | Disposition: A | Discharge status: Home Health | Payer: Self-pay | Source: Ambulatory Visit | Attending: Internal Medicine

## 2016-07-06 ENCOUNTER — Ambulatory Visit (HOSPITAL_COMMUNITY)
Admission: RE | Admit: 2016-07-06 | Discharge: 2016-07-06 | Disposition: A | Discharge status: Home Health | Payer: Medicare Other | Source: Ambulatory Visit | Attending: Internal Medicine | Admitting: Internal Medicine

## 2016-07-06 DIAGNOSIS — K6289 Other specified diseases of anus and rectum: Secondary | ICD-10-CM | POA: Diagnosis not present

## 2016-07-06 DIAGNOSIS — Z1211 Encounter for screening for malignant neoplasm of colon: Secondary | ICD-10-CM | POA: Diagnosis not present

## 2016-07-06 DIAGNOSIS — Z860100 Personal history of colon polyps, unspecified: Secondary | ICD-10-CM | POA: Insufficient documentation

## 2016-07-06 DIAGNOSIS — K5730 Diverticulosis of large intestine without perforation or abscess without bleeding: Secondary | ICD-10-CM | POA: Diagnosis not present

## 2016-07-06 DIAGNOSIS — D120 Benign neoplasm of cecum: Secondary | ICD-10-CM | POA: Diagnosis not present

## 2016-07-06 DIAGNOSIS — K648 Other hemorrhoids: Secondary | ICD-10-CM | POA: Diagnosis not present

## 2016-07-06 DIAGNOSIS — K644 Residual hemorrhoidal skin tags: Secondary | ICD-10-CM | POA: Insufficient documentation

## 2016-07-06 DIAGNOSIS — Z79899 Other long term (current) drug therapy: Secondary | ICD-10-CM | POA: Diagnosis not present

## 2016-07-06 DIAGNOSIS — Z800 Family history of malignant neoplasm of digestive organs: Secondary | ICD-10-CM | POA: Insufficient documentation

## 2016-07-06 DIAGNOSIS — Z09 Encounter for follow-up examination after completed treatment for conditions other than malignant neoplasm: Secondary | ICD-10-CM | POA: Diagnosis not present

## 2016-07-06 HISTORY — PX: COLONOSCOPY: SHX5424

## 2016-07-06 SURGERY — COLONOSCOPY
Anesthesia: Moderate Sedation

## 2016-07-06 MED ORDER — SODIUM CHLORIDE 0.9 % IV SOLN
INTRAVENOUS | Status: DC
  Administered 2016-07-06: 1000 mL via INTRAVENOUS

## 2016-07-06 MED ORDER — MIDAZOLAM HCL 5 MG/5ML IJ SOLN
INTRAMUSCULAR | Status: DC | PRN
  Administered 2016-07-06: 1 mg via INTRAVENOUS
  Administered 2016-07-06 (×3): 2 mg via INTRAVENOUS

## 2016-07-06 MED ORDER — MIDAZOLAM HCL 5 MG/5ML IJ SOLN
INTRAMUSCULAR | Status: AC
  Filled 2016-07-06: qty 10

## 2016-07-06 MED ORDER — MEPERIDINE HCL 50 MG/ML IJ SOLN
INTRAMUSCULAR | Status: AC
  Filled 2016-07-06: qty 1

## 2016-07-06 MED ORDER — MEPERIDINE HCL 50 MG/ML IJ SOLN
INTRAMUSCULAR | Status: DC | PRN
  Administered 2016-07-06 (×2): 25 mg via INTRAVENOUS

## 2016-07-06 NOTE — Op Note (Signed)
[facility] Patient Name: Shawn Bullock Procedure Date: 07/06/2016 7:08 AM MRN: [id number] Date of Birth: 10-30-1945 Attending MD: Hildred Laser , MD CSN: [id number] Age: 71 Admit Type: Outpatient Procedure:                Colonoscopy Indications:              High risk colon cancer surveillance: Personal                            history of colonic polyps Providers:                Hildred Laser, MD, Charlyne Petrin RN, RN, Aram Candela Referring MD:             Manon Hilding, MD Medicines:                Meperidine 50 mg IV, Midazolam 7 mg IV Complications:            No immediate complications. Estimated Blood Loss:     Estimated blood loss was minimal. Procedure:                Pre-Anesthesia Assessment:                           - Prior to the procedure, a History and Physical                            was performed, and patient medications and                            allergies were reviewed. The patient's tolerance of                            previous anesthesia was also reviewed. The risks                            and benefits of the procedure and the sedation                            options and risks were discussed with the patient.                            All questions were answered, and informed consent                            was obtained. Prior Anticoagulants: The patient has                            taken no previous anticoagulant or antiplatelet                            agents. ASA Grade Assessment: I - A normal, healthy  patient. After reviewing the risks and benefits,                            the patient was deemed in satisfactory condition to                            undergo the procedure.                           After obtaining informed consent, the colonoscope                            was passed under direct vision. Throughout the                            procedure, the  patient's blood pressure, pulse, and                            oxygen saturations were monitored continuously. The                            [id number]) scope was introduced through                            the anus and advanced to the the cecum, identified                            by appendiceal orifice and ileocecal valve. The                            colonoscopy was performed without difficulty. The                            patient tolerated the procedure well. The quality                            of the bowel preparation was good. The ileocecal                            valve, appendiceal orifice, and rectum were                            photographed. Scope In: 7:43:39 AM Scope Out: 8:00:20 AM Scope Withdrawal Time: 0 hours 10 minutes 42 seconds  Total Procedure Duration: 0 hours 16 minutes 41 seconds  Findings:      The perianal and digital rectal examinations were normal.      A 4 mm polyp was found in the cecum. The polyp was sessile. The polyp       was removed with a cold snare. Resection and retrieval were complete.      A few small-mouthed diverticula were found in the sigmoid colon.      Internal hemorrhoids were found during retroflexion. The hemorrhoids       were small.      Anal papilla(e) were hypertrophied. Impression:               -  One 4 mm polyp in the cecum, removed with a cold                            snare. Resected and retrieved.                           - Diverticulosis in the sigmoid colon.                           - Internal hemorrhoids.                           - Anal papilla(e) were hypertrophied. Moderate Sedation:      Moderate (conscious) sedation was administered by the endoscopy nurse       and supervised by the endoscopist. The following parameters were       monitored: oxygen saturation, heart rate, blood pressure, CO2       capnography and response to care. Total physician intraservice time was       25  minutes. Recommendation:           - Patient has a contact number available for                            emergencies. The signs and symptoms of potential                            delayed complications were discussed with the                            patient. Return to normal activities tomorrow.                            Written discharge instructions were provided to the                            patient.                           - High fiber diet today.                           - Continue present medications.                           - Await pathology results.                           - Repeat colonoscopy date to be determined after                            pending pathology results are reviewed. Procedure Code(s):        --- Professional ---                           [phone], Colonoscopy, flexible; with removal of  tumor(s), polyp(s), or other lesion(s) by snare                            technique                           99152, Moderate sedation services provided by the                            same physician or other qualified health care                            professional performing the diagnostic or                            therapeutic service that the sedation supports,                            requiring the presence of an independent trained                            observer to assist in the monitoring of the                            patient's level of consciousness and physiological                            status; initial 15 minutes of intraservice time,                            patient age 78 years or older                           [id number], Moderate sedation services; each additional                            15 minutes intraservice time Diagnosis Code(s):        --- Professional ---                           Z86.010, Personal history of colonic polyps                           D12.0, Benign neoplasm of cecum                            K64.8, Other hemorrhoids                           K62.89, Other specified diseases of anus and rectum                           K57.30, Diverticulosis of large intestine without                            perforation or abscess  without bleeding CPT copyright 2016 American Medical Association. All rights reserved. The codes documented in this report are preliminary and upon coder review may  be revised to meet current compliance requirements. Hildred Laser, MD Hildred Laser, MD 07/06/2016 8:08:23 AM This report has been signed electronically. Number of Addenda: 0

## 2016-07-06 NOTE — H&P (Signed)
Shawn Bullock is an 71 y.o. male.   Chief Complaint: Patient is here for colonoscopy. HPI: Patient is 71 year old Caucasian male who is 8 and colonoscopy. He had colonoscopy in December 2010 with removal of small tubular adenoma. Follow-up exam was met in 7 years. He denies abdominal pain change in bowel habits or rectal bleeding. Family history is negative for CRC.  History reviewed. No pertinent past medical history.  Past Surgical History:  Procedure Laterality Date  . ESOPHAGOGASTRODUODENOSCOPY N/A 05/03/2013   Procedure: ESOPHAGOGASTRODUODENOSCOPY (EGD);  Surgeon: Rogene Houston, MD;  Location: [facility];  Service: Endoscopy;  Laterality: N/A;  100-rescheduled to 240 Ann notified pt  . KNEE ARTHROSCOPY     8-9 yrs ago    History reviewed. No pertinent family history. Social History:  reports that he has never smoked. He has never used smokeless tobacco. He reports that he drinks alcohol. He reports that he does not use drugs.  Allergies: No Known Allergies  Medications Prior to Admission  Medication Sig Dispense Refill  . Multiple Vitamins-Minerals (MULTIVITAMIN PO) Take 1 tablet by mouth daily.     . Omega-3 Fatty Acids (FISH OIL) 1000 MG CPDR Take 2 capsules by mouth daily.     . Red Yeast Rice Extract (RED YEAST RICE PO) Take 1 capsule by mouth 2 (two) times daily.       No results found for this or any previous visit (from the past 48 hour(s)). No results found.  ROS  Blood pressure 138/74, pulse 76, temperature 97.8 F (36.6 C), temperature source Oral, resp. rate 16, height _0  (1.778 m), weight 160 lb (72.6 kg), SpO2 100 %. Physical Exam  Constitutional:  Well-developed and Caucasian male in NAD.  HENT:  Mouth/Throat: Oropharynx is clear and moist.  Eyes: Conjunctivae are normal. No scleral icterus.  Neck: No thyromegaly present.  Cardiovascular: Normal rate, regular rhythm and normal heart sounds.   No murmur heard. Respiratory: Effort normal and  breath sounds normal.  GI: Soft. He exhibits no distension and no mass. There is no tenderness.  Musculoskeletal: He exhibits no edema.  Lymphadenopathy:    He has no cervical adenopathy.  Neurological: He is alert.  Skin: Skin is warm and dry.     Assessment/Plan History of tubular adenoma. Surveillance colonoscopy.  Hildred Laser, MD 07/06/2016, 7:30 AM

## 2016-07-06 NOTE — Discharge Instructions (Signed)
Resume usual medications and high fiber diet. No driving for 24 hours. Physician will call with biopsy results.   Colonoscopy, Adult, Care After This sheet gives you information about how to care for yourself after your procedure. Your health care provider may also give you more specific instructions. If you have problems or questions, contact your health care provider. What can I expect after the procedure? After the procedure, it is common to have:  A small amount of blood in your stool for 24 hours after the procedure.  Some gas.  Mild abdominal cramping or bloating. Follow these instructions at home: General instructions  For the first 24 hours after the procedure:  Do not drive or use machinery.  Do not sign important documents.  Do not drink alcohol.  Do your regular daily activities at a slower pace than normal.  Eat soft, easy-to-digest foods.  Rest often.  Take over-the-counter or prescription medicines only as told by your health care provider.  It is up to you to get the results of your procedure. Ask your health care provider, or the department performing the procedure, when your results will be ready. Relieving cramping and bloating  Try walking around when you have cramps or feel bloated.  Apply heat to your abdomen as told by your health care provider. Use a heat source that your health care provider recommends, such as a moist heat pack or a heating pad.  Place a towel between your skin and the heat source.  Leave the heat on for 20-30 minutes.  Remove the heat if your skin turns bright red. This is especially important if you are unable to feel pain, heat, or cold. You may have a greater risk of getting burned. Eating and drinking  Drink enough fluid to keep your urine clear or pale yellow.  Resume your normal diet as instructed by your health care provider. Avoid heavy or fried foods that are hard to digest.  Avoid drinking alcohol for as long as  instructed by your health care provider. Contact a health care provider if:  You have blood in your stool 2-3 days after the procedure. Get help right away if:  You have more than a small spotting of blood in your stool.  You pass large blood clots in your stool.  Your abdomen is swollen.  You have nausea or vomiting.  You have a fever.  You have increasing abdominal pain that is not relieved with medicine.   Colon Polyps Introduction Polyps are tissue growths inside the body. Polyps can grow in many places, including the large intestine (colon). A polyp may be a round bump or a mushroom-shaped growth. You could have one polyp or several. Most colon polyps are noncancerous (benign). However, some colon polyps can become cancerous over time. What are the causes? The exact cause of colon polyps is not known. What increases the risk? This condition is more likely to develop in people who:  Have a family history of colon cancer or colon polyps.  Are older than 63 or older than 45 if they are African American.  Have inflammatory bowel disease, such as ulcerative colitis or Crohn disease.  Are overweight.  Smoke cigarettes.  Do not get enough exercise.  Drink too much alcohol.  Eat a diet that is:  High in fat and red meat.  Low in fiber.  Had childhood cancer that was treated with abdominal radiation. What are the signs or symptoms? Most polyps do not cause symptoms. If you  have symptoms, they may include:  Blood coming from your rectum when having a bowel movement.  Blood in your stool.The stool may look dark red or black.  A change in bowel habits, such as constipation or diarrhea. How is this diagnosed? This condition is diagnosed with a colonoscopy. This is a procedure that uses a lighted, flexible scope to look at the inside of your colon. How is this treated? Treatment for this condition involves removing any polyps that are found. Those polyps will then  be tested for cancer. If cancer is found, your health care provider will talk to you about options for colon cancer treatment. Follow these instructions at home: Diet  Eat plenty of fiber, such as fruits, vegetables, and whole grains.  Eat foods that are high in calcium and vitamin D, such as milk, cheese, yogurt, eggs, liver, fish, and broccoli.  Limit foods high in fat, red meats, and processed meats, such as hot dogs, sausage, bacon, and lunch meats.  Maintain a healthy weight, or lose weight if recommended by your health care provider. General instructions  Do not smoke cigarettes.  Do not drink alcohol excessively.  Keep all follow-up visits as told by your health care provider. This is important. This includes keeping regularly scheduled colonoscopies. Talk to your health care provider about when you need a colonoscopy.  Exercise every day or as told by your health care provider. Contact a health care provider if:  You have new or worsening bleeding during a bowel movement.  You have new or increased blood in your stool.  You have a change in bowel habits.  You unexpectedly lose weight.

## 2016-07-08 ENCOUNTER — Encounter (HOSPITAL_COMMUNITY): Payer: Self-pay | Admitting: Internal Medicine

## 2017-03-06 ENCOUNTER — Other Ambulatory Visit: Payer: Self-pay | Admitting: Dermatology

## 2017-03-06 DIAGNOSIS — D229 Melanocytic nevi, unspecified: Secondary | ICD-10-CM | POA: Diagnosis not present

## 2017-03-06 DIAGNOSIS — D044 Carcinoma in situ of skin of scalp and neck: Secondary | ICD-10-CM | POA: Diagnosis not present

## 2017-03-06 DIAGNOSIS — L814 Other melanin hyperpigmentation: Secondary | ICD-10-CM | POA: Diagnosis not present

## 2017-03-06 DIAGNOSIS — C4492 Squamous cell carcinoma of skin, unspecified: Secondary | ICD-10-CM

## 2017-03-06 DIAGNOSIS — L570 Actinic keratosis: Secondary | ICD-10-CM | POA: Diagnosis not present

## 2017-03-06 DIAGNOSIS — D492 Neoplasm of unspecified behavior of bone, soft tissue, and skin: Secondary | ICD-10-CM | POA: Diagnosis not present

## 2017-03-06 HISTORY — DX: Squamous cell carcinoma of skin, unspecified: C44.92

## 2017-03-08 DIAGNOSIS — Z23 Encounter for immunization: Secondary | ICD-10-CM | POA: Diagnosis not present

## 2017-03-23 DIAGNOSIS — D044 Carcinoma in situ of skin of scalp and neck: Secondary | ICD-10-CM | POA: Diagnosis not present

## 2017-04-13 DIAGNOSIS — X32XXXD Exposure to sunlight, subsequent encounter: Secondary | ICD-10-CM | POA: Diagnosis not present

## 2017-04-13 DIAGNOSIS — L570 Actinic keratosis: Secondary | ICD-10-CM | POA: Diagnosis not present

## 2017-04-13 DIAGNOSIS — Z08 Encounter for follow-up examination after completed treatment for malignant neoplasm: Secondary | ICD-10-CM | POA: Diagnosis not present

## 2017-04-13 DIAGNOSIS — Z85828 Personal history of other malignant neoplasm of skin: Secondary | ICD-10-CM | POA: Diagnosis not present

## 2017-04-14 DIAGNOSIS — F411 Generalized anxiety disorder: Secondary | ICD-10-CM | POA: Diagnosis not present

## 2017-04-14 DIAGNOSIS — N400 Enlarged prostate without lower urinary tract symptoms: Secondary | ICD-10-CM | POA: Diagnosis not present

## 2017-04-14 DIAGNOSIS — R5382 Chronic fatigue, unspecified: Secondary | ICD-10-CM | POA: Diagnosis not present

## 2017-04-14 DIAGNOSIS — K210 Gastro-esophageal reflux disease with esophagitis: Secondary | ICD-10-CM | POA: Diagnosis not present

## 2017-04-14 DIAGNOSIS — M1711 Unilateral primary osteoarthritis, right knee: Secondary | ICD-10-CM | POA: Diagnosis not present

## 2017-04-14 DIAGNOSIS — E7800 Pure hypercholesterolemia, unspecified: Secondary | ICD-10-CM | POA: Diagnosis not present

## 2017-04-19 DIAGNOSIS — Z6824 Body mass index (BMI) 24.0-24.9, adult: Secondary | ICD-10-CM | POA: Diagnosis not present

## 2017-04-19 DIAGNOSIS — N401 Enlarged prostate with lower urinary tract symptoms: Secondary | ICD-10-CM | POA: Diagnosis not present

## 2017-06-19 DIAGNOSIS — H0100A Unspecified blepharitis right eye, upper and lower eyelids: Secondary | ICD-10-CM | POA: Diagnosis not present

## 2017-06-19 DIAGNOSIS — H25813 Combined forms of age-related cataract, bilateral: Secondary | ICD-10-CM | POA: Diagnosis not present

## 2017-06-19 DIAGNOSIS — H52203 Unspecified astigmatism, bilateral: Secondary | ICD-10-CM | POA: Diagnosis not present

## 2017-06-19 DIAGNOSIS — H40053 Ocular hypertension, bilateral: Secondary | ICD-10-CM | POA: Diagnosis not present

## 2017-07-10 DIAGNOSIS — L570 Actinic keratosis: Secondary | ICD-10-CM | POA: Diagnosis not present

## 2017-08-30 DIAGNOSIS — H40053 Ocular hypertension, bilateral: Secondary | ICD-10-CM | POA: Diagnosis not present

## 2017-10-02 DIAGNOSIS — H40053 Ocular hypertension, bilateral: Secondary | ICD-10-CM | POA: Diagnosis not present

## 2018-02-12 DIAGNOSIS — L821 Other seborrheic keratosis: Secondary | ICD-10-CM | POA: Diagnosis not present

## 2018-02-12 DIAGNOSIS — L570 Actinic keratosis: Secondary | ICD-10-CM | POA: Diagnosis not present

## 2018-03-02 DIAGNOSIS — Z23 Encounter for immunization: Secondary | ICD-10-CM | POA: Diagnosis not present

## 2018-04-05 DIAGNOSIS — H40053 Ocular hypertension, bilateral: Secondary | ICD-10-CM | POA: Diagnosis not present

## 2018-04-05 DIAGNOSIS — H0100B Unspecified blepharitis left eye, upper and lower eyelids: Secondary | ICD-10-CM | POA: Diagnosis not present

## 2018-04-05 DIAGNOSIS — H2513 Age-related nuclear cataract, bilateral: Secondary | ICD-10-CM | POA: Diagnosis not present

## 2018-04-05 DIAGNOSIS — H0100A Unspecified blepharitis right eye, upper and lower eyelids: Secondary | ICD-10-CM | POA: Diagnosis not present

## 2018-04-18 DIAGNOSIS — Z6824 Body mass index (BMI) 24.0-24.9, adult: Secondary | ICD-10-CM | POA: Diagnosis not present

## 2018-04-18 DIAGNOSIS — J209 Acute bronchitis, unspecified: Secondary | ICD-10-CM | POA: Diagnosis not present

## 2018-04-18 DIAGNOSIS — R05 Cough: Secondary | ICD-10-CM | POA: Diagnosis not present

## 2018-04-19 DIAGNOSIS — F411 Generalized anxiety disorder: Secondary | ICD-10-CM | POA: Diagnosis not present

## 2018-04-19 DIAGNOSIS — R5382 Chronic fatigue, unspecified: Secondary | ICD-10-CM | POA: Diagnosis not present

## 2018-04-19 DIAGNOSIS — E785 Hyperlipidemia, unspecified: Secondary | ICD-10-CM | POA: Diagnosis not present

## 2018-04-19 DIAGNOSIS — K210 Gastro-esophageal reflux disease with esophagitis: Secondary | ICD-10-CM | POA: Diagnosis not present

## 2018-04-23 DIAGNOSIS — Z6825 Body mass index (BMI) 25.0-25.9, adult: Secondary | ICD-10-CM | POA: Diagnosis not present

## 2018-04-23 DIAGNOSIS — Z0000 Encounter for general adult medical examination without abnormal findings: Secondary | ICD-10-CM | POA: Diagnosis not present

## 2018-06-06 DIAGNOSIS — Z23 Encounter for immunization: Secondary | ICD-10-CM | POA: Diagnosis not present

## 2018-07-30 ENCOUNTER — Other Ambulatory Visit: Payer: Self-pay | Admitting: Dermatology

## 2018-07-30 DIAGNOSIS — D485 Neoplasm of uncertain behavior of skin: Secondary | ICD-10-CM | POA: Diagnosis not present

## 2018-07-30 DIAGNOSIS — D229 Melanocytic nevi, unspecified: Secondary | ICD-10-CM | POA: Diagnosis not present

## 2018-07-30 DIAGNOSIS — L570 Actinic keratosis: Secondary | ICD-10-CM | POA: Diagnosis not present

## 2018-10-24 DIAGNOSIS — H40053 Ocular hypertension, bilateral: Secondary | ICD-10-CM | POA: Diagnosis not present

## 2018-10-24 DIAGNOSIS — H25093 Other age-related incipient cataract, bilateral: Secondary | ICD-10-CM | POA: Diagnosis not present

## 2019-02-18 DIAGNOSIS — Z23 Encounter for immunization: Secondary | ICD-10-CM | POA: Diagnosis not present

## 2019-05-28 DIAGNOSIS — E782 Mixed hyperlipidemia: Secondary | ICD-10-CM | POA: Diagnosis not present

## 2019-05-28 DIAGNOSIS — N138 Other obstructive and reflux uropathy: Secondary | ICD-10-CM | POA: Diagnosis not present

## 2019-05-28 DIAGNOSIS — Z0001 Encounter for general adult medical examination with abnormal findings: Secondary | ICD-10-CM | POA: Diagnosis not present

## 2019-05-28 DIAGNOSIS — N401 Enlarged prostate with lower urinary tract symptoms: Secondary | ICD-10-CM | POA: Diagnosis not present

## 2019-05-28 DIAGNOSIS — K219 Gastro-esophageal reflux disease without esophagitis: Secondary | ICD-10-CM | POA: Diagnosis not present

## 2019-06-03 DIAGNOSIS — Z0001 Encounter for general adult medical examination with abnormal findings: Secondary | ICD-10-CM | POA: Diagnosis not present

## 2019-06-03 DIAGNOSIS — N401 Enlarged prostate with lower urinary tract symptoms: Secondary | ICD-10-CM | POA: Diagnosis not present

## 2019-06-03 DIAGNOSIS — E782 Mixed hyperlipidemia: Secondary | ICD-10-CM | POA: Diagnosis not present

## 2019-06-03 DIAGNOSIS — N138 Other obstructive and reflux uropathy: Secondary | ICD-10-CM | POA: Diagnosis not present

## 2019-06-03 DIAGNOSIS — K219 Gastro-esophageal reflux disease without esophagitis: Secondary | ICD-10-CM | POA: Diagnosis not present

## 2019-06-03 DIAGNOSIS — Z6825 Body mass index (BMI) 25.0-25.9, adult: Secondary | ICD-10-CM | POA: Diagnosis not present

## 2019-06-17 ENCOUNTER — Other Ambulatory Visit: Payer: Self-pay | Admitting: Dermatology

## 2019-06-17 DIAGNOSIS — D485 Neoplasm of uncertain behavior of skin: Secondary | ICD-10-CM | POA: Diagnosis not present

## 2019-06-17 DIAGNOSIS — L570 Actinic keratosis: Secondary | ICD-10-CM | POA: Diagnosis not present

## 2019-06-23 ENCOUNTER — Ambulatory Visit: Payer: Medicare Other

## 2019-10-19 DIAGNOSIS — Z20828 Contact with and (suspected) exposure to other viral communicable diseases: Secondary | ICD-10-CM | POA: Diagnosis not present

## 2020-02-03 DIAGNOSIS — Z6825 Body mass index (BMI) 25.0-25.9, adult: Secondary | ICD-10-CM | POA: Diagnosis not present

## 2020-02-03 DIAGNOSIS — Z1389 Encounter for screening for other disorder: Secondary | ICD-10-CM | POA: Diagnosis not present

## 2020-02-03 DIAGNOSIS — F411 Generalized anxiety disorder: Secondary | ICD-10-CM | POA: Diagnosis not present

## 2020-02-03 DIAGNOSIS — Z1331 Encounter for screening for depression: Secondary | ICD-10-CM | POA: Diagnosis not present

## 2020-03-04 DIAGNOSIS — Z23 Encounter for immunization: Secondary | ICD-10-CM | POA: Diagnosis not present

## 2020-03-09 DIAGNOSIS — R197 Diarrhea, unspecified: Secondary | ICD-10-CM | POA: Diagnosis not present

## 2020-03-10 DIAGNOSIS — R1013 Epigastric pain: Secondary | ICD-10-CM | POA: Diagnosis not present

## 2020-03-10 DIAGNOSIS — R197 Diarrhea, unspecified: Secondary | ICD-10-CM | POA: Diagnosis not present

## 2020-03-10 DIAGNOSIS — Z6824 Body mass index (BMI) 24.0-24.9, adult: Secondary | ICD-10-CM | POA: Diagnosis not present

## 2020-03-17 ENCOUNTER — Encounter (INDEPENDENT_AMBULATORY_CARE_PROVIDER_SITE_OTHER): Payer: Self-pay | Admitting: Internal Medicine

## 2020-03-17 ENCOUNTER — Other Ambulatory Visit: Payer: Self-pay

## 2020-03-17 ENCOUNTER — Ambulatory Visit (INDEPENDENT_AMBULATORY_CARE_PROVIDER_SITE_OTHER): Payer: Medicare Other | Admitting: Internal Medicine

## 2020-03-17 VITALS — BP 110/71 | HR 76 | Temp 97.8°F | Ht 70.0 in | Wt 169.1 lb

## 2020-03-17 DIAGNOSIS — K2270 Barrett's esophagus without dysplasia: Secondary | ICD-10-CM | POA: Diagnosis not present

## 2020-03-17 DIAGNOSIS — R197 Diarrhea, unspecified: Secondary | ICD-10-CM | POA: Diagnosis not present

## 2020-03-17 DIAGNOSIS — K219 Gastro-esophageal reflux disease without esophagitis: Secondary | ICD-10-CM | POA: Diagnosis not present

## 2020-03-17 LAB — CBC WITH DIFFERENTIAL/PLATELET
Absolute Monocytes: 456 cells/uL (ref 200–950)
Basophils Absolute: 42 cells/uL (ref 0–200)
Eosinophils Absolute: 12 cells/uL — ABNORMAL LOW (ref 15–500)
Eosinophils Relative: 0.2 %
Lymphs Abs: 2112 cells/uL (ref 850–3900)
MCV: 95.3 fL (ref 80.0–100.0)
Neutro Abs: 3378 cells/uL (ref 1500–7800)
RDW: 12.4 % (ref 11.0–15.0)
WBC: 6 10*3/uL (ref 3.8–10.8)

## 2020-03-17 MED ORDER — LOPERAMIDE HCL 2 MG PO CAPS: 2.0000 mg | ORAL_CAPSULE | Freq: Two times a day (BID) | ORAL | Status: DC | PRN

## 2020-03-17 NOTE — Patient Instructions (Signed)
Physician will call with results of blood and stool test when completed. Further recommendations to follow.

## 2020-03-17 NOTE — Progress Notes (Signed)
Presenting complaint;  Diarrhea. History of GERD with short segment Barrett's esophagus.  Database and subjective:  Patient is 74 year old Caucasian male who is here for scheduled visit.  He was last seen in February 2018 for colonoscopy.  He also has history of short segment Barrett's esophagus which was diagnosed back in December 2014 when he presented with epigastric pain nocturnal regurgitation and frequent heartburn. He stopped his PPI several months ago because he was doing well with dietary measures. He says present illness started about 1 month ago when he began to experience heartburn and substernal soreness.  He took Pepcid which helped some.  He was seen by Dr. Quintin Alto who advised him to go back on pantoprazole.  His heartburn and retrosternal soreness has resolved but now he has developed diarrhea and lower abdominal discomfort.  He has been taking Imodium at night so that he would not have nocturnal diarrhea.  He was having 2-3 stools per day along with urgency but lately he has been having only one stool but it is loose.  He has not experienced melena or rectal bleeding.  Cramping is very mild and relieved with defecation.  No history of recent antibiotic use or travel outside the country.  He drinks city water.  He says his appetite is good and he has not lost any weight.  He states he has been under a lot of stress due to break up in personal relationship but he feels he is overcome the situation. He remains very active.  He has received both doses of Covid vaccine manufactured by [employer].  He remains very active.  He goes to the gym at least 4 times in a week.  He does weights and elliptical for total of 75 minutes.  He drinks alcohol socially but no more than 4 drinks per week.  He has history of colonic polyps.  His last colonoscopy was in February 2018 with removal of small tubular adenoma.  He also had sigmoid diverticulosis and internal hemorrhoids.  Family history significant for  colon carcinoma in maternal aunt who was in her 52 and died within a year of advanced disease.   Current Medications: Outpatient Encounter Medications as of 03/17/2020  Medication Sig  . melatonin 5 MG TABS Take 5 mg by mouth at bedtime.  . Multiple Vitamins-Minerals (MULTIVITAMIN PO) Take 1 tablet by mouth daily.   . Omega-3 Fatty Acids (FISH OIL) 1000 MG CPDR Take 1 capsule by mouth daily.   Marland Kitchen OVER THE COUNTER MEDICATION Marcene Brawn MD - Hervey Ard, Fruits , Veggie Supplements - Patient takes 3 tabs daily.  Marland Kitchen OVER THE COUNTER MEDICATION Niacinamide 500 mg - Patient takes 2 by mouth daily.  . pantoprazole (PROTONIX) 40 MG tablet Take 40 mg by mouth daily.  . Red Yeast Rice Extract (RED YEAST RICE PO) Take 1 capsule by mouth 2 (two) times daily.   Marland Kitchen VITAMIN D PO Take 125 mg by mouth daily.   No facility-administered encounter medications on file as of 03/17/2020.   Past medical history  Chronic GERD complicated by short segment Barrett's esophagus. History of colonic polyps as above.  History of malignant hyperthermia. Knee arthroscopy over 15 years ago. History of left ureteral stone status post endoscopic removal with stenting in April 2011 followed by lithotripsy.  Stent subsequently removed.  Objective: Blood pressure 110/71, pulse 76, temperature 97.8 F (36.6 C), temperature source Oral, height 5\' 10"  (1.778 m), weight 169 lb 1.6 oz (76.7 kg). Patient is alert and in no acute distress.  He is wearing a mask. Conjunctiva is pink. Sclera is nonicteric Oropharyngeal mucosa is normal. No neck masses or thyromegaly noted. Cardiac exam with regular rhythm normal S1 and S2. No murmur or gallop noted. Lungs are clear to auscultation. Abdomen is flat.  Bowel sounds are hyperactive.  On palpation abdomen is soft.  He has very mild discomfort in left lower quadrant.  No organomegaly or masses. Rectal examination reveals no external abnormalities.  Sphincter tone is normal.  He has small amount  of brownish stool in the vault.  Stool is guaiac negative. No LE edema or clubbing noted.  Labs/studies Results:  Stool Shiga toxin 1 and 2 collected on 03/09/2020.   Assessment:  #1.  Recent onset of nonbloody diarrhea.  Stool test limited only to Shiga toxin was negative.  It remains to be seen if he has an infection or stress induced irritable bowel syndrome or inflammatory bowel disease.  Reassuring to note that stool is guaiac negative.  #2.  Chronic GERD complicated by short segment Barrett's esophagus.  Last EGD was in December 2014.  He may consider EGD at some point in future.  He needs to be on chronic PPI therapy which can be daily or every other day.   Plan:  He can continue Imodium OTC 2 mg p.o. twice daily as needed. He will go to the lab for CBC with differential, CRP and sed rate GI pathogen panel. Further recommendations to follow.

## 2020-03-18 ENCOUNTER — Ambulatory Visit (INDEPENDENT_AMBULATORY_CARE_PROVIDER_SITE_OTHER): Payer: Medicare Other | Admitting: Internal Medicine

## 2020-03-18 DIAGNOSIS — R197 Diarrhea, unspecified: Secondary | ICD-10-CM | POA: Diagnosis not present

## 2020-03-18 LAB — CBC WITH DIFFERENTIAL/PLATELET
Basophils Relative: 0.7 %
HCT: 45 % (ref 38.5–50.0)
Hemoglobin: 15.1 g/dL (ref 13.2–17.1)
MCH: 32 pg (ref 27.0–33.0)
MCHC: 33.6 g/dL (ref 32.0–36.0)
MPV: 9.5 fL (ref 7.5–12.5)
Monocytes Relative: 7.6 %
Neutrophils Relative %: 56.3 %
Platelets: 273 10*3/uL (ref 140–400)
RBC: 4.72 10*6/uL (ref 4.20–5.80)
Total Lymphocyte: 35.2 %

## 2020-03-18 LAB — SEDIMENTATION RATE: Sed Rate: 6 mm/h (ref 0–20)

## 2020-03-18 LAB — C-REACTIVE PROTEIN: CRP: 1.7 mg/L (ref <8.0)

## 2020-03-20 LAB — GASTROINTESTINAL PATHOGEN PANEL PCR
C. difficile Tox A/B, PCR: NOT DETECTED
Campylobacter, PCR: NOT DETECTED
Cryptosporidium, PCR: NOT DETECTED
E coli (ETEC) LT/ST PCR: NOT DETECTED
E coli (STEC) stx1/stx2, PCR: NOT DETECTED
E coli 0157, PCR: NOT DETECTED
Giardia lamblia, PCR: NOT DETECTED
Norovirus, PCR: NOT DETECTED
Rotavirus A, PCR: NOT DETECTED
Salmonella, PCR: NOT DETECTED
Shigella, PCR: NOT DETECTED

## 2020-03-23 DIAGNOSIS — Z23 Encounter for immunization: Secondary | ICD-10-CM | POA: Diagnosis not present

## 2020-05-20 ENCOUNTER — Ambulatory Visit (INDEPENDENT_AMBULATORY_CARE_PROVIDER_SITE_OTHER): Payer: Medicare Other | Admitting: Physician Assistant

## 2020-05-20 ENCOUNTER — Other Ambulatory Visit: Payer: Self-pay

## 2020-05-20 ENCOUNTER — Encounter: Payer: Self-pay | Admitting: Physician Assistant

## 2020-05-20 ENCOUNTER — Ambulatory Visit: Payer: Medicare Other | Admitting: Dermatology

## 2020-05-20 DIAGNOSIS — Z8589 Personal history of malignant neoplasm of other organs and systems: Secondary | ICD-10-CM

## 2020-05-20 DIAGNOSIS — D485 Neoplasm of uncertain behavior of skin: Secondary | ICD-10-CM

## 2020-05-20 DIAGNOSIS — L570 Actinic keratosis: Secondary | ICD-10-CM | POA: Diagnosis not present

## 2020-05-20 DIAGNOSIS — D044 Carcinoma in situ of skin of scalp and neck: Secondary | ICD-10-CM

## 2020-05-20 DIAGNOSIS — Z85828 Personal history of other malignant neoplasm of skin: Secondary | ICD-10-CM | POA: Diagnosis not present

## 2020-05-20 DIAGNOSIS — C44629 Squamous cell carcinoma of skin of left upper limb, including shoulder: Secondary | ICD-10-CM | POA: Diagnosis not present

## 2020-05-20 DIAGNOSIS — Z1283 Encounter for screening for malignant neoplasm of skin: Secondary | ICD-10-CM | POA: Diagnosis not present

## 2020-05-20 MED ORDER — TOLAK 4 % EX CREA: 1.0000 "application " | TOPICAL_CREAM | Freq: Every day | CUTANEOUS | 1 refills | Status: DC

## 2020-05-20 NOTE — Patient Instructions (Signed)

## 2020-05-20 NOTE — Progress Notes (Signed)
Follow-Up Visit   Subjective  Shawn Bullock is a 75 y.o. male who presents for the following: Annual Exam (Left top hand & scalp crust ).   The following portions of the chart were reviewed this encounter and updated as appropriate:      Objective  Well appearing patient in no apparent distress; mood and affect are within normal limits.  All skin waist up examined.  Objective  Scalp: Multiple white scar- clear   Objective  waist up: No atypical nevi Dyspigmented scar on scalp. Waist up skin examination  Objective  Left Elbow - Posterior, Left Forearm - Posterior, Left Frontal Scalp, Left Preauricular Area, Mid Parietal Scalp (2): Diffuse pink scale. Erythematous patches with gritty scale.  Objective  Mid Parietal Scalp: Hyperkeratotic scale with pink base      Objective  Left Dorsal Hand: Hyperkeratotic scale with pink base      Assessment & Plan  History of squamous cell carcinoma Scalp  Yearly skin check  Screening exam for skin cancer waist up  Yearly skin check  AK (actinic keratosis) (6) Left Elbow - Posterior; Left Forearm - Posterior; Left Frontal Scalp; Mid Parietal Scalp (2); Left Preauricular Area  Destruction of lesion - Left Elbow - Posterior, Left Forearm - Posterior, Left Frontal Scalp, Left Preauricular Area, Mid Parietal Scalp (2) Complexity: simple   Destruction method: cryotherapy   Informed consent: discussed and consent obtained   Timeout:  patient name, date of birth, surgical site, and procedure verified Lesion destroyed using liquid nitrogen: Yes   Cryotherapy cycles:  3 Outcome: patient tolerated procedure well with no complications    Fluorouracil (TOLAK) 4 % CREA - Left Elbow - Posterior, Left Forearm - Posterior, Left Frontal Scalp, Left Preauricular Area, Mid Parietal Scalp (2)  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  Skin / nail biopsy Type of biopsy: tangential   Informed consent: discussed and  consent obtained   Timeout: patient name, date of birth, surgical site, and procedure verified   Procedure prep:  Patient was prepped and draped in usual sterile fashion (Non sterile) Prep type:  Chlorhexidine Anesthesia: the lesion was anesthetized in a standard fashion   Anesthetic:  1% lidocaine w/ epinephrine 1-100,000 local infiltration Instrument used: flexible razor blade   Outcome: patient tolerated procedure well   Post-procedure details: wound care instructions given    Destruction of lesion Complexity: simple   Destruction method: electrodesiccation and curettage   Informed consent: discussed and consent obtained   Timeout:  patient name, date of birth, surgical site, and procedure verified Anesthesia: the lesion was anesthetized in a standard fashion   Anesthetic:  1% lidocaine w/ epinephrine 1-100,000 local infiltration Curettage performed in three different directions: Yes   Electrodesiccation performed over the curetted area: Yes   Curettage cycles:  1 Margin per side (cm):  0.1 Final wound size (cm):  3.1 Hemostasis achieved with:  aluminum chloride Outcome: patient tolerated procedure well with no complications   Post-procedure details: wound care instructions given    Specimen 1 - Surgical pathology Differential Diagnosis: bcc vs scc  Check Margins: No  SCC (squamous cell carcinoma), hand, left Left Dorsal Hand  Skin / nail biopsy Type of biopsy: tangential   Informed consent: discussed and consent obtained   Timeout: patient name, date of birth, surgical site, and procedure verified   Procedure prep:  Patient was prepped and draped in usual sterile fashion (Non sterile) Prep type:  Chlorhexidine Anesthesia: the lesion  was anesthetized in a standard fashion   Anesthetic:  1% lidocaine w/ epinephrine 1-100,000 local infiltration Instrument used: flexible razor blade   Outcome: patient tolerated procedure well   Post-procedure details: wound care  instructions given    Destruction of lesion Complexity: simple   Destruction method: electrodesiccation and curettage   Informed consent: discussed and consent obtained   Timeout:  patient name, date of birth, surgical site, and procedure verified Anesthesia: the lesion was anesthetized in a standard fashion   Anesthetic:  1% lidocaine w/ epinephrine 1-100,000 local infiltration Curettage performed in three different directions: Yes   Electrodesiccation performed over the curetted area: Yes   Curettage cycles:  1 Margin per side (cm):  0.1 Final wound size (cm):  1.3 Hemostasis achieved with:  aluminum chloride Outcome: patient tolerated procedure well with no complications   Post-procedure details: wound care instructions given    Specimen 2 - Surgical pathology Differential Diagnosis: bcc vs scc  Check Margins: No   I, Shacara Cozine, PA-C, have reviewed all documentation's for this visit.  The documentation on 05/29/20 for the exam, diagnosis, procedures and orders are all accurate and complete.

## 2020-05-25 ENCOUNTER — Encounter: Payer: Self-pay | Admitting: *Deleted

## 2020-05-25 ENCOUNTER — Telehealth: Payer: Self-pay | Admitting: *Deleted

## 2020-05-25 ENCOUNTER — Other Ambulatory Visit (INDEPENDENT_AMBULATORY_CARE_PROVIDER_SITE_OTHER): Payer: Self-pay | Admitting: Internal Medicine

## 2020-05-25 NOTE — Telephone Encounter (Signed)
Path to patient Shawn Bullock has treated these spots at the time of the biopsy. Patient has appointment with Dr.Tafeen already scheduled.

## 2020-05-25 NOTE — Progress Notes (Signed)
Stool diary reviewed with patient.   Diarrhea has resolved.  He kept diary for 1 month from November 9 through December 8.  He is not having formed stools.  He has stopped Imodium. Diarrhea has resolved.  Therefore no further work-up. She also wants to use pantoprazole on as-needed basis. Last EGD revealed 2 small islands of Barrett's mucosa without dysplasia May consider EGD when he has colonoscopy in 2025. Medication list updated.

## 2020-05-29 ENCOUNTER — Encounter: Payer: Self-pay | Admitting: Physician Assistant

## 2020-05-29 NOTE — Telephone Encounter (Signed)
-----   Message from Lavonna Monarch, MD sent at 05/29/2020  6:37 AM EST ----- Schedule surgery with Dr. Darene Lamer

## 2020-05-29 NOTE — Telephone Encounter (Signed)
This encounter was created in error - please disregard.

## 2020-06-08 DIAGNOSIS — K219 Gastro-esophageal reflux disease without esophagitis: Secondary | ICD-10-CM | POA: Diagnosis not present

## 2020-06-08 DIAGNOSIS — E782 Mixed hyperlipidemia: Secondary | ICD-10-CM | POA: Diagnosis not present

## 2020-06-08 DIAGNOSIS — K2100 Gastro-esophageal reflux disease with esophagitis, without bleeding: Secondary | ICD-10-CM | POA: Diagnosis not present

## 2020-06-08 DIAGNOSIS — R5382 Chronic fatigue, unspecified: Secondary | ICD-10-CM | POA: Diagnosis not present

## 2020-06-08 DIAGNOSIS — E7849 Other hyperlipidemia: Secondary | ICD-10-CM | POA: Diagnosis not present

## 2020-06-11 DIAGNOSIS — Z6825 Body mass index (BMI) 25.0-25.9, adult: Secondary | ICD-10-CM | POA: Diagnosis not present

## 2020-06-11 DIAGNOSIS — R7301 Impaired fasting glucose: Secondary | ICD-10-CM | POA: Diagnosis not present

## 2020-06-11 DIAGNOSIS — K219 Gastro-esophageal reflux disease without esophagitis: Secondary | ICD-10-CM | POA: Diagnosis not present

## 2020-06-11 DIAGNOSIS — F411 Generalized anxiety disorder: Secondary | ICD-10-CM | POA: Diagnosis not present

## 2020-06-11 DIAGNOSIS — E7849 Other hyperlipidemia: Secondary | ICD-10-CM | POA: Diagnosis not present

## 2020-12-04 DIAGNOSIS — Z125 Encounter for screening for malignant neoplasm of prostate: Secondary | ICD-10-CM | POA: Diagnosis not present

## 2020-12-04 DIAGNOSIS — R7301 Impaired fasting glucose: Secondary | ICD-10-CM | POA: Diagnosis not present

## 2020-12-04 DIAGNOSIS — E7849 Other hyperlipidemia: Secondary | ICD-10-CM | POA: Diagnosis not present

## 2020-12-04 DIAGNOSIS — E782 Mixed hyperlipidemia: Secondary | ICD-10-CM | POA: Diagnosis not present

## 2020-12-04 DIAGNOSIS — K2100 Gastro-esophageal reflux disease with esophagitis, without bleeding: Secondary | ICD-10-CM | POA: Diagnosis not present

## 2020-12-09 DIAGNOSIS — F411 Generalized anxiety disorder: Secondary | ICD-10-CM | POA: Diagnosis not present

## 2020-12-09 DIAGNOSIS — Z6823 Body mass index (BMI) 23.0-23.9, adult: Secondary | ICD-10-CM | POA: Diagnosis not present

## 2020-12-09 DIAGNOSIS — E7849 Other hyperlipidemia: Secondary | ICD-10-CM | POA: Diagnosis not present

## 2020-12-09 DIAGNOSIS — K219 Gastro-esophageal reflux disease without esophagitis: Secondary | ICD-10-CM | POA: Diagnosis not present

## 2020-12-09 DIAGNOSIS — R7301 Impaired fasting glucose: Secondary | ICD-10-CM | POA: Diagnosis not present

## 2020-12-28 DIAGNOSIS — H40053 Ocular hypertension, bilateral: Secondary | ICD-10-CM | POA: Diagnosis not present

## 2021-01-07 DIAGNOSIS — Z6823 Body mass index (BMI) 23.0-23.9, adult: Secondary | ICD-10-CM | POA: Diagnosis not present

## 2021-01-07 DIAGNOSIS — M7550 Bursitis of unspecified shoulder: Secondary | ICD-10-CM | POA: Diagnosis not present

## 2021-01-27 DIAGNOSIS — E7849 Other hyperlipidemia: Secondary | ICD-10-CM | POA: Diagnosis not present

## 2021-01-27 DIAGNOSIS — F17210 Nicotine dependence, cigarettes, uncomplicated: Secondary | ICD-10-CM | POA: Diagnosis not present

## 2021-01-27 DIAGNOSIS — F411 Generalized anxiety disorder: Secondary | ICD-10-CM | POA: Diagnosis not present

## 2021-01-27 DIAGNOSIS — Z0001 Encounter for general adult medical examination with abnormal findings: Secondary | ICD-10-CM | POA: Diagnosis not present

## 2021-02-04 DIAGNOSIS — M25512 Pain in left shoulder: Secondary | ICD-10-CM | POA: Diagnosis not present

## 2021-02-04 DIAGNOSIS — M25511 Pain in right shoulder: Secondary | ICD-10-CM | POA: Diagnosis not present

## 2021-02-11 DIAGNOSIS — M7551 Bursitis of right shoulder: Secondary | ICD-10-CM | POA: Diagnosis not present

## 2021-02-11 DIAGNOSIS — M7552 Bursitis of left shoulder: Secondary | ICD-10-CM | POA: Diagnosis not present

## 2021-02-17 DIAGNOSIS — Z23 Encounter for immunization: Secondary | ICD-10-CM | POA: Diagnosis not present

## 2021-03-23 ENCOUNTER — Ambulatory Visit (INDEPENDENT_AMBULATORY_CARE_PROVIDER_SITE_OTHER): Payer: Medicare Other | Admitting: Internal Medicine

## 2021-03-29 ENCOUNTER — Encounter: Payer: Self-pay | Admitting: Dermatology

## 2021-03-29 ENCOUNTER — Other Ambulatory Visit: Payer: Self-pay

## 2021-03-29 ENCOUNTER — Ambulatory Visit (INDEPENDENT_AMBULATORY_CARE_PROVIDER_SITE_OTHER): Payer: Medicare Other | Admitting: Dermatology

## 2021-03-29 DIAGNOSIS — Z1283 Encounter for screening for malignant neoplasm of skin: Secondary | ICD-10-CM

## 2021-03-29 DIAGNOSIS — L570 Actinic keratosis: Secondary | ICD-10-CM

## 2021-04-19 ENCOUNTER — Encounter: Payer: Self-pay | Admitting: Dermatology

## 2021-04-19 NOTE — Progress Notes (Signed)
   Follow-Up Visit   Subjective  Shawn Bullock is a 75 y.o. male who presents for the following: Skin Problem (Scalp- scaly spots).  General skin check, new scaly spots on scalp Location:  Duration:  Quality:  Associated Signs/Symptoms: Modifying Factors:  Severity:  Timing: Context:   Objective  Well appearing patient in no apparent distress; mood and affect are within normal limits. Mid Back Waist up skin exam.  No atypical pigmented lesions.  No new or recurrent nonmelanoma skin cancers.  Right Hand - Posterior, Right Temple, Scalp (6) Mixture of a dozen thicker actinic keratoses plus diffuse involvement on scalp and upper face    All skin waist up examined.   Assessment & Plan    Screening for malignant neoplasm of skin Mid Back  Yearly skin exam.  Self examine twice annually.  Continue ultraviolet protection.  AK (actinic keratosis) (8) Right Hand - Posterior; Right Temple; Scalp (6)  LN2 to the thicker lesions, repeat fluorouracil in January.  Goal of 28 total applications but call me if daily application produces too much irritation.  Destruction of lesion - Right Hand - Posterior, Right Temple, Scalp Complexity: simple   Destruction method: cryotherapy   Informed consent: discussed and consent obtained   Lesion destroyed using liquid nitrogen: Yes   Outcome: patient tolerated procedure well with no complications    Related Medications Fluorouracil (TOLAK) 4 % CREA Apply 1 application topically at bedtime. Apply to affected area Monday - Sunday x 2 weeks      I, Lavonna Monarch, MD, have reviewed all documentation for this visit.  The documentation on 04/19/21 for the exam, diagnosis, procedures, and orders are all accurate and complete.

## 2021-05-24 ENCOUNTER — Ambulatory Visit: Payer: Medicare Other | Admitting: Dermatology

## 2021-12-03 DIAGNOSIS — E7849 Other hyperlipidemia: Secondary | ICD-10-CM | POA: Diagnosis not present

## 2021-12-03 DIAGNOSIS — R7301 Impaired fasting glucose: Secondary | ICD-10-CM | POA: Diagnosis not present

## 2021-12-03 DIAGNOSIS — K219 Gastro-esophageal reflux disease without esophagitis: Secondary | ICD-10-CM | POA: Diagnosis not present

## 2021-12-08 DIAGNOSIS — Z6824 Body mass index (BMI) 24.0-24.9, adult: Secondary | ICD-10-CM | POA: Diagnosis not present

## 2021-12-08 DIAGNOSIS — E7849 Other hyperlipidemia: Secondary | ICD-10-CM | POA: Diagnosis not present

## 2021-12-08 DIAGNOSIS — F411 Generalized anxiety disorder: Secondary | ICD-10-CM | POA: Diagnosis not present

## 2021-12-08 DIAGNOSIS — Z23 Encounter for immunization: Secondary | ICD-10-CM | POA: Diagnosis not present

## 2021-12-08 DIAGNOSIS — R7301 Impaired fasting glucose: Secondary | ICD-10-CM | POA: Diagnosis not present

## 2021-12-08 DIAGNOSIS — K219 Gastro-esophageal reflux disease without esophagitis: Secondary | ICD-10-CM | POA: Diagnosis not present

## 2022-01-10 DIAGNOSIS — H2513 Age-related nuclear cataract, bilateral: Secondary | ICD-10-CM | POA: Diagnosis not present

## 2022-01-10 DIAGNOSIS — H11153 Pinguecula, bilateral: Secondary | ICD-10-CM | POA: Diagnosis not present

## 2022-01-10 DIAGNOSIS — Z8669 Personal history of other diseases of the nervous system and sense organs: Secondary | ICD-10-CM | POA: Diagnosis not present

## 2022-01-10 DIAGNOSIS — Z83511 Family history of glaucoma: Secondary | ICD-10-CM | POA: Diagnosis not present

## 2022-01-10 DIAGNOSIS — H40053 Ocular hypertension, bilateral: Secondary | ICD-10-CM | POA: Diagnosis not present

## 2022-02-17 ENCOUNTER — Ambulatory Visit (INDEPENDENT_AMBULATORY_CARE_PROVIDER_SITE_OTHER): Payer: Medicare Other | Admitting: Gastroenterology

## 2022-02-17 ENCOUNTER — Encounter (INDEPENDENT_AMBULATORY_CARE_PROVIDER_SITE_OTHER): Payer: Self-pay

## 2022-02-17 ENCOUNTER — Encounter (INDEPENDENT_AMBULATORY_CARE_PROVIDER_SITE_OTHER): Payer: Self-pay | Admitting: Gastroenterology

## 2022-02-17 ENCOUNTER — Other Ambulatory Visit (INDEPENDENT_AMBULATORY_CARE_PROVIDER_SITE_OTHER): Payer: Self-pay

## 2022-02-17 VITALS — BP 147/88 | HR 68 | Temp 97.7°F | Ht 70.0 in | Wt 170.7 lb

## 2022-02-17 DIAGNOSIS — K2270 Barrett's esophagus without dysplasia: Secondary | ICD-10-CM

## 2022-02-17 DIAGNOSIS — K219 Gastro-esophageal reflux disease without esophagitis: Secondary | ICD-10-CM

## 2022-02-17 NOTE — H&P (View-Only) (Signed)
Referring Provider: Manon Hilding, MD Primary Care Physician:  Manon Hilding, MD Primary GI Physician: Previously [name]   Chief Complaint  Patient presents with   Follow-up    Patient here today for a follow up visit. Last seen by dr. Laural Golden in November 2021. His last Tcs was in 2018. Patient says he thinks he is in need for a tcs. Denies any current gi issues.   HPI:   Shawn Bullock is a 76 y.o. male with past medical history of Barrett's esophagus, SCC, GERD.  Patient presenting today for follow up of Barrett's esophagus/GERD.  History: short segment Barrett's esophagus which was diagnosed  in December 2014. He had stopped his PPI prior to last visit in 2021 because he was doing well with dietary measures though began experiencing heartburn and substernal soreness about 1 month prior to his OV and PCP put him back on protonix. Also with lower abdominal pain and diarrhea with urgency at that time.   Patient continued on daily PPI, CRP, sed rate, gi pathogen panel ordered which were all WNL/negative gi path panel.   Present: Patient reports that he is not taking any PPI therapy at this time. He reports occasional heartburn maybe a couple of times a month and takes mylanta for this with good results, acid regurgitation is very rare, maybe once every 6-8 months. Symptoms are usually related to trigger foods such a chocolate or beer. Denies abdominal pain or changes in bowel habits. He inquired if he needs to have colonoscopy updated as PCP told him with hx of tubular adenoma he should have a sooner TCS than 2025 as previously recommended by Dr. Laural Golden. No red flag symptoms. Patient denies melena, hematochezia, nausea, vomiting, diarrhea, constipation, dysphagia, odyonophagia, early satiety or weight loss.   Last Colonoscopy: February 2018 small tubular adenoma.  He also had sigmoid diverticulosis and internal hemorrhoids. Last Endoscopy:2014 small islands of Barrett's mucosa without  dysplasia  Recommendations:  Repeat colonoscopy in 2025  Past Medical History:  Diagnosis Date   SCC (squamous cell carcinoma) 03/06/2017   left scalp tx cx3 40f   SCC (squamous cell carcinoma) 03/06/2017   left scalp tx =cx3 5 fu   SCC (squamous cell carcinoma) 05/20/2020   mid pariteal scalp, left dorsal habd CX35FU   Squamous cell carcinoma of skin 11/09/2015   right post scalp tx=mohs    Past Surgical History:  Procedure Laterality Date   COLONOSCOPY N/A 07/06/2016   Procedure: COLONOSCOPY;  Surgeon: NRogene Houston MD;  [facility];  Service: Endoscopy;  Laterality: N/A;  730   ESOPHAGOGASTRODUODENOSCOPY N/A 05/03/2013   Procedure: ESOPHAGOGASTRODUODENOSCOPY (EGD);  Surgeon: NRogene Houston MD;  Location: [facility];  Service: Endoscopy;  Laterality: N/A;  100-rescheduled to 240 Ann notified pt   KNEE ARTHROSCOPY     8-9 yrs ago    Current Outpatient Medications  Medication Sig Dispense Refill   Multiple Vitamins-Minerals (MULTIVITAMIN PO) Take 1 tablet by mouth daily.      Omega-3 Fatty Acids (FISH OIL) 1000 MG CPDR Take 1 capsule by mouth daily.      OVER THE COUNTER MEDICATION KMarcene BrawnMD - GHervey Ard Fruits , Veggie Supplements - Patient takes 3 tabs daily.     OVER THE COUNTER MEDICATION Niacinamide 500 mg - Patient takes 2 by mouth daily.     Red Yeast Rice Extract (RED YEAST RICE PO) Take 1 capsule by mouth 2 (two) times daily.      VITAMIN  D PO Take 125 mg by mouth daily.     No current facility-administered medications for this visit.    Allergies as of 02/17/2022   (No Known Allergies)    History reviewed. No pertinent family history.  Social History   Socioeconomic History   Marital status: Divorced    Spouse name: Not on file   Number of children: Not on file   Years of education: Not on file   Highest education level: Not on file  Occupational History   Not on file  Tobacco Use   Smoking status: Never   Smokeless tobacco: Never   Vaping Use   Vaping Use: Never used  Substance and Sexual Activity   Alcohol use: Yes    Comment: couple of beer every 2-3 days   Drug use: No   Sexual activity: Not on file  Other Topics Concern   Not on file  Social History Narrative   Not on file   Social Determinants of Health   Financial Resource Strain: Not on file  Food Insecurity: Not on file  Transportation Needs: Not on file  Physical Activity: Not on file  Stress: Not on file  Social Connections: Not on file   Review of systems General: negative for malaise, night sweats, fever, chills, weight loss Neck: Negative for lumps, goiter, pain and significant neck swelling Resp: Negative for cough, wheezing, dyspnea at rest CV: Negative for chest pain, leg swelling, palpitations, orthopnea GI: denies melena, hematochezia, nausea, vomiting, diarrhea, constipation, dysphagia, odyonophagia, early satiety or unintentional weight loss. +occasional GERD symptoms  MSK: Negative for joint pain or swelling, back pain, and muscle pain. Derm: Negative for itching or rash Psych: Denies depression, anxiety, memory loss, confusion. No homicidal or suicidal ideation.  Heme: Negative for prolonged bleeding, bruising easily, and swollen nodes. Endocrine: Negative for cold or heat intolerance, polyuria, polydipsia and goiter. Neuro: negative for tremor, gait imbalance, syncope and seizures. The remainder of the review of systems is noncontributory.  Physical Exam: BP (!) 147/88 (BP Location: Left Arm, Patient Position: Sitting, Cuff Size: Small)   Pulse 68   Temp 97.7 F (36.5 C) (Oral)   Ht '5\' 10"'$  (1.778 m)   Wt 170 lb 11.2 oz (77.4 kg)   BMI 24.49 kg/m  General:   Alert and oriented. No distress noted. Pleasant and cooperative.  Head:  Normocephalic and atraumatic. Eyes:  Conjuctiva clear without scleral icterus. Mouth:  Oral mucosa pink and moist. Good dentition. No lesions. Heart: Normal rate and rhythm, s1 and s2 heart sounds  present.  Lungs: Clear lung sounds in all lobes. Respirations equal and unlabored. Abdomen:  +BS, soft, non-tender and non-distended. No rebound or guarding. No HSM or masses noted. Derm: No palmar erythema or jaundice Msk:  Symmetrical without gross deformities. Normal posture. Extremities:  Without edema. Neurologic:  Alert and  oriented x4 Psych:  Alert and cooperative. Normal mood and affect.  Invalid input(s): "6 MONTHS"   ASSESSMENT: DELONTA YOHANNES is a 76 y.o. male presenting today for follow up of GERD/Barrett's esophagus  Barrett's esophagus diagnosed in 2014 with no dysplasia, no endoscopic follow up since then. He is not currently on PPI, having heartburn maybe once per month related to certain trigger foods, takes mylanta which provides relief. He does not really want to go back on PPI. I had a thorough discussion with the patient regarding Barrett's esophagus, indications of PPI therapy, implications of the disease and importance of routine interval EGD's and well managed GERD.  Would recommend updating EGD given timing of last evaluation, to which patient is amenable. I did recommend restarting PPI, however, patient prefers to continue with dietary modifications at this time to manage his GERD. Again, as above, risks of uncontrolled GERD in presence of Barrett's esophagus was reiterated with the patient.   In regards to repeat colonoscopy, previously recommended interval of 7 years follows current guidelines as he had one tubular adenoma that was 67m in size, will plan to repeat TCS in 2025 as initially planned.   PLAN:  EGD for Barrett's, ASA II, ENDO 1  2.  Strict GERD management with diet  3. Repeat colonoscopy 2025  All questions were answered, patient verbalized understanding and is in agreement with plan as outlined above.   Follow Up: 1 year   Marayah Higdon L. CAlver Sorrow MSN, APRN, AGNP-C Adult-Gerontology Nurse Practitioner [facility]  I have  reviewed the note and agree with the APP's assessment as described in this progress note  DMaylon Peppers MD Gastroenterology and Hepatology [facility]

## 2022-02-17 NOTE — Patient Instructions (Signed)
It was nice to meet you!  We will get you set up for repeat upper endoscopy given your history of Barrett's esophagus as it has been 10+ years since your last one. It is very important that your GERD is well controlled with history of Barrett's as episodes of heartburn/acid regurgitation put you at higher risk for advancing the barrett's esophagus, I would recommend if you do not wish to restart protonix that you carefully manage your diet and avoid foods/beverages that cause your GERD symptoms.  Follow up 1 year

## 2022-02-17 NOTE — Progress Notes (Addendum)
Referring Provider: Manon Hilding, MD Primary Care Physician:  Manon Hilding, MD Primary GI Physician: Previously [name]   Chief Complaint  Patient presents with   Follow-up    Patient here today for a follow up visit. Last seen by dr. Laural Golden in November 2021. His last Tcs was in 2018. Patient says he thinks he is in need for a tcs. Denies any current gi issues.   HPI:   Shawn Bullock is a 76 y.o. male with past medical history of Barrett's esophagus, SCC, GERD.  Patient presenting today for follow up of Barrett's esophagus/GERD.  History: short segment Barrett's esophagus which was diagnosed  in December 2014. He had stopped his PPI prior to last visit in 2021 because he was doing well with dietary measures though began experiencing heartburn and substernal soreness about 1 month prior to his OV and PCP put him back on protonix. Also with lower abdominal pain and diarrhea with urgency at that time.   Patient continued on daily PPI, CRP, sed rate, gi pathogen panel ordered which were all WNL/negative gi path panel.   Present: Patient reports that he is not taking any PPI therapy at this time. He reports occasional heartburn maybe a couple of times a month and takes mylanta for this with good results, acid regurgitation is very rare, maybe once every 6-8 months. Symptoms are usually related to trigger foods such a chocolate or beer. Denies abdominal pain or changes in bowel habits. He inquired if he needs to have colonoscopy updated as PCP told him with hx of tubular adenoma he should have a sooner TCS than 2025 as previously recommended by Dr. Laural Golden. No red flag symptoms. Patient denies melena, hematochezia, nausea, vomiting, diarrhea, constipation, dysphagia, odyonophagia, early satiety or weight loss.   Last Colonoscopy: February 2018 small tubular adenoma.  He also had sigmoid diverticulosis and internal hemorrhoids. Last Endoscopy:2014 small islands of Barrett's mucosa without  dysplasia  Recommendations:  Repeat colonoscopy in 2025  Past Medical History:  Diagnosis Date   SCC (squamous cell carcinoma) 03/06/2017   left scalp tx cx3 5f   SCC (squamous cell carcinoma) 03/06/2017   left scalp tx =cx3 5 fu   SCC (squamous cell carcinoma) 05/20/2020   mid pariteal scalp, left dorsal habd CX35FU   Squamous cell carcinoma of skin 11/09/2015   right post scalp tx=mohs    Past Surgical History:  Procedure Laterality Date   COLONOSCOPY N/A 07/06/2016   Procedure: COLONOSCOPY;  Surgeon: NRogene Houston MD;  [facility];  Service: Endoscopy;  Laterality: N/A;  730   ESOPHAGOGASTRODUODENOSCOPY N/A 05/03/2013   Procedure: ESOPHAGOGASTRODUODENOSCOPY (EGD);  Surgeon: NRogene Houston MD;  [facility];  Service: Endoscopy;  Laterality: N/A;  100-rescheduled to 240 Ann notified pt   KNEE ARTHROSCOPY     8-9 yrs ago    Current Outpatient Medications  Medication Sig Dispense Refill   Multiple Vitamins-Minerals (MULTIVITAMIN PO) Take 1 tablet by mouth daily.      Omega-3 Fatty Acids (FISH OIL) 1000 MG CPDR Take 1 capsule by mouth daily.      OVER THE COUNTER MEDICATION KMarcene BrawnMD - GHervey Ard Fruits , Veggie Supplements - Patient takes 3 tabs daily.     OVER THE COUNTER MEDICATION Niacinamide 500 mg - Patient takes 2 by mouth daily.     Red Yeast Rice Extract (RED YEAST RICE PO) Take 1 capsule by mouth 2 (two) times daily.      VITAMIN  D PO Take 125 mg by mouth daily.     No current facility-administered medications for this visit.    Allergies as of 02/17/2022   (No Known Allergies)    History reviewed. No pertinent family history.  Social History   Socioeconomic History   Marital status: Divorced    Spouse name: Not on file   Number of children: Not on file   Years of education: Not on file   Highest education level: Not on file  Occupational History   Not on file  Tobacco Use   Smoking status: Never   Smokeless tobacco: Never   Vaping Use   Vaping Use: Never used  Substance and Sexual Activity   Alcohol use: Yes    Comment: couple of beer every 2-3 days   Drug use: No   Sexual activity: Not on file  Other Topics Concern   Not on file  Social History Narrative   Not on file   Social Determinants of Health   Financial Resource Strain: Not on file  Food Insecurity: Not on file  Transportation Needs: Not on file  Physical Activity: Not on file  Stress: Not on file  Social Connections: Not on file   Review of systems General: negative for malaise, night sweats, fever, chills, weight loss Neck: Negative for lumps, goiter, pain and significant neck swelling Resp: Negative for cough, wheezing, dyspnea at rest CV: Negative for chest pain, leg swelling, palpitations, orthopnea GI: denies melena, hematochezia, nausea, vomiting, diarrhea, constipation, dysphagia, odyonophagia, early satiety or unintentional weight loss. +occasional GERD symptoms  MSK: Negative for joint pain or swelling, back pain, and muscle pain. Derm: Negative for itching or rash Psych: Denies depression, anxiety, memory loss, confusion. No homicidal or suicidal ideation.  Heme: Negative for prolonged bleeding, bruising easily, and swollen nodes. Endocrine: Negative for cold or heat intolerance, polyuria, polydipsia and goiter. Neuro: negative for tremor, gait imbalance, syncope and seizures. The remainder of the review of systems is noncontributory.  Physical Exam: BP (!) 147/88 (BP Location: Left Arm, Patient Position: Sitting, Cuff Size: Small)   Pulse 68   Temp 97.7 F (36.5 C) (Oral)   Ht '5\' 10"'$  (1.778 m)   Wt 170 lb 11.2 oz (77.4 kg)   BMI 24.49 kg/m  General:   Alert and oriented. No distress noted. Pleasant and cooperative.  Head:  Normocephalic and atraumatic. Eyes:  Conjuctiva clear without scleral icterus. Mouth:  Oral mucosa pink and moist. Good dentition. No lesions. Heart: Normal rate and rhythm, s1 and s2 heart sounds  present.  Lungs: Clear lung sounds in all lobes. Respirations equal and unlabored. Abdomen:  +BS, soft, non-tender and non-distended. No rebound or guarding. No HSM or masses noted. Derm: No palmar erythema or jaundice Msk:  Symmetrical without gross deformities. Normal posture. Extremities:  Without edema. Neurologic:  Alert and  oriented x4 Psych:  Alert and cooperative. Normal mood and affect.  Invalid input(s): "6 MONTHS"   ASSESSMENT: Shawn Bullock is a 76 y.o. male presenting today for follow up of GERD/Barrett's esophagus  Barrett's esophagus diagnosed in 2014 with no dysplasia, no endoscopic follow up since then. He is not currently on PPI, having heartburn maybe once per month related to certain trigger foods, takes mylanta which provides relief. He does not really want to go back on PPI. I had a thorough discussion with the patient regarding Barrett's esophagus, indications of PPI therapy, implications of the disease and importance of routine interval EGD's and well managed GERD.  Would recommend updating EGD given timing of last evaluation, to which patient is amenable. I did recommend restarting PPI, however, patient prefers to continue with dietary modifications at this time to manage his GERD. Again, as above, risks of uncontrolled GERD in presence of Barrett's esophagus was reiterated with the patient.   In regards to repeat colonoscopy, previously recommended interval of 7 years follows current guidelines as he had one tubular adenoma that was 62m in size, will plan to repeat TCS in 2025 as initially planned.   PLAN:  EGD for Barrett's, ASA II, ENDO 1  2.  Strict GERD management with diet  3. Repeat colonoscopy 2025  All questions were answered, patient verbalized understanding and is in agreement with plan as outlined above.   Follow Up: 1 year   Adrinne Sze L. CAlver Sorrow MSN, APRN, AGNP-C Adult-Gerontology Nurse Practitioner [facility]  I have  reviewed the note and agree with the APP's assessment as described in this progress note  DMaylon Peppers MD Gastroenterology and Hepatology CDover Emergency RoomGastroenterology

## 2022-02-28 ENCOUNTER — Encounter (HOSPITAL_COMMUNITY)
Admission: RE | Admit: 2022-02-28 | Discharge: 2022-02-28 | Disposition: A | Discharge status: Home / Self Care | Payer: Medicare Other | Source: Ambulatory Visit | Attending: Gastroenterology | Admitting: Gastroenterology

## 2022-02-28 ENCOUNTER — Encounter (HOSPITAL_COMMUNITY): Payer: Self-pay

## 2022-02-28 ENCOUNTER — Other Ambulatory Visit: Payer: Self-pay

## 2022-03-02 ENCOUNTER — Encounter (HOSPITAL_COMMUNITY): Payer: Self-pay | Admitting: Gastroenterology

## 2022-03-02 ENCOUNTER — Encounter (HOSPITAL_COMMUNITY)
Admission: RE | Disposition: A | Discharge status: Home / Self Care | Payer: Self-pay | Source: Ambulatory Visit | Attending: Gastroenterology

## 2022-03-02 ENCOUNTER — Other Ambulatory Visit: Payer: Self-pay

## 2022-03-02 ENCOUNTER — Ambulatory Visit (HOSPITAL_BASED_OUTPATIENT_CLINIC_OR_DEPARTMENT_OTHER): Payer: Medicare Other | Admitting: Certified Registered Nurse Anesthetist

## 2022-03-02 ENCOUNTER — Ambulatory Visit (HOSPITAL_COMMUNITY): Payer: Medicare Other | Admitting: Certified Registered Nurse Anesthetist

## 2022-03-02 ENCOUNTER — Ambulatory Visit (HOSPITAL_COMMUNITY)
Admission: RE | Admit: 2022-03-02 | Discharge: 2022-03-02 | Disposition: A | Discharge status: Home / Self Care | Payer: Medicare Other | Source: Ambulatory Visit | Attending: Gastroenterology | Admitting: Gastroenterology

## 2022-03-02 DIAGNOSIS — K259 Gastric ulcer, unspecified as acute or chronic, without hemorrhage or perforation: Secondary | ICD-10-CM | POA: Diagnosis not present

## 2022-03-02 DIAGNOSIS — K2950 Unspecified chronic gastritis without bleeding: Secondary | ICD-10-CM | POA: Diagnosis not present

## 2022-03-02 DIAGNOSIS — K449 Diaphragmatic hernia without obstruction or gangrene: Secondary | ICD-10-CM | POA: Diagnosis not present

## 2022-03-02 DIAGNOSIS — K3189 Other diseases of stomach and duodenum: Secondary | ICD-10-CM | POA: Insufficient documentation

## 2022-03-02 DIAGNOSIS — K2100 Gastro-esophageal reflux disease with esophagitis, without bleeding: Secondary | ICD-10-CM | POA: Insufficient documentation

## 2022-03-02 DIAGNOSIS — Z09 Encounter for follow-up examination after completed treatment for conditions other than malignant neoplasm: Secondary | ICD-10-CM | POA: Insufficient documentation

## 2022-03-02 DIAGNOSIS — K2980 Duodenitis without bleeding: Secondary | ICD-10-CM | POA: Insufficient documentation

## 2022-03-02 DIAGNOSIS — K2270 Barrett's esophagus without dysplasia: Secondary | ICD-10-CM | POA: Diagnosis not present

## 2022-03-02 HISTORY — PX: BIOPSY: SHX5522

## 2022-03-02 HISTORY — PX: ESOPHAGOGASTRODUODENOSCOPY (EGD) WITH PROPOFOL: SHX5813

## 2022-03-02 SURGERY — ESOPHAGOGASTRODUODENOSCOPY (EGD) WITH PROPOFOL
Anesthesia: General

## 2022-03-02 MED ORDER — PROPOFOL 10 MG/ML IV BOLUS
INTRAVENOUS | Status: DC | PRN
  Administered 2022-03-02: 80 mg via INTRAVENOUS

## 2022-03-02 MED ORDER — PROPOFOL 500 MG/50ML IV EMUL
INTRAVENOUS | Status: AC
  Filled 2022-03-02: qty 50

## 2022-03-02 MED ORDER — PROPOFOL 500 MG/50ML IV EMUL
INTRAVENOUS | Status: DC | PRN
  Administered 2022-03-02: 180 ug/kg/min via INTRAVENOUS

## 2022-03-02 MED ORDER — LACTATED RINGERS IV SOLN: INTRAVENOUS | Status: DC | PRN

## 2022-03-02 MED ORDER — LIDOCAINE 2% (20 MG/ML) 5 ML SYRINGE
INTRAMUSCULAR | Status: DC | PRN
  Administered 2022-03-02: 50 mg via INTRAVENOUS

## 2022-03-02 MED ORDER — LIDOCAINE HCL (PF) 2 % IJ SOLN
INTRAMUSCULAR | Status: AC
  Filled 2022-03-02: qty 5

## 2022-03-02 MED ORDER — OMEPRAZOLE 20 MG PO CPDR: 20.0000 mg | DELAYED_RELEASE_CAPSULE | Freq: Every day | ORAL | 3 refills | Status: AC

## 2022-03-02 NOTE — Discharge Instructions (Signed)
You are being discharged to home.  Resume your previous diet.  We are waiting for your pathology results.  Take Prilosec (omeprazole) 20 mg by mouth once a day.  Your physician has recommended a repeat upper endoscopy in three months to assess disease activity.

## 2022-03-02 NOTE — Op Note (Signed)
[facility] Patient Name: Shawn Bullock Procedure Date: 03/02/2022 9:11 AM MRN: [id number] Date of Birth: 03/04/1946 Attending MD: Maylon Peppers ,  CSN: [id number] Age: 76 Admit Type: Outpatient Procedure:                Upper GI endoscopy Indications:              Follow-up of Barrett's esophagus Providers:                Maylon Peppers, Janeece Riggers, RN, Everardo Pacific Referring MD:              Medicines:                Monitored Anesthesia Care Complications:            No immediate complications. Estimated Blood Loss:     Estimated blood loss: none. Procedure:                Pre-Anesthesia Assessment:                           - Prior to the procedure, a History and Physical                            was performed, and patient medications, allergies                            and sensitivities were reviewed. The patient's                            tolerance of previous anesthesia was reviewed.                           - The risks and benefits of the procedure and the                            sedation options and risks were discussed with the                            patient. All questions were answered and informed                            consent was obtained.                           - ASA Grade Assessment: II - A patient with mild                            systemic disease.                           After obtaining informed consent, the endoscope was                            passed under direct vision. Throughout the                            procedure, the patient's blood  pressure, pulse, and                            oxygen saturations were monitored continuously. The                            [id number]-[id number] ([id number]) scope was introduced through the                            mouth, and advanced to the second part of duodenum.                            The upper GI endoscopy was accomplished without                            difficulty. The  patient tolerated the procedure                            well. Scope In: 9:33:31 AM Scope Out: 9:39:38 AM Total Procedure Duration: 0 hours 6 minutes 7 seconds  Findings:      LA Grade A (one or more mucosal breaks less than 5 mm, not extending       between tops of 2 mucosal folds) esophagitis with no bleeding was found       at the gastroesophageal junction.      A 2 cm hiatal hernia was present.      Multiple localized small erosions with no stigmata of recent bleeding       were found in the gastric antrum. Biopsies were taken with a cold       forceps for Helicobacter pylori testing.      Patchy mild inflammation characterized by congestion (edema) and       erythema was found in the duodenal bulb. Impression:               - LA Grade A reflux esophagitis with no bleeding.                           - 2 cm hiatal hernia.                           - Erosive gastropathy with no stigmata of recent                            bleeding. Biopsied.                           - Duodenitis. Moderate Sedation:      Per Anesthesia Care Recommendation:           - Discharge patient to home (ambulatory).                           - Resume previous diet.                           - Await pathology results.                           -  Use Prilosec (omeprazole) 20 mg PO daily.                           - Repeat upper endoscopy in 3 months to assess                            disease activity. Procedure Code(s):        --- Professional ---                           [phone], Esophagogastroduodenoscopy, flexible,                            transoral; with biopsy, single or multiple Diagnosis Code(s):        --- Professional ---                           K21.00, Gastro-esophageal reflux disease with                            esophagitis, without bleeding                           K44.9, Diaphragmatic hernia without obstruction or                            gangrene                            K31.89, Other diseases of stomach and duodenum                           K29.80, Duodenitis without bleeding                           K22.70, Barrett's esophagus without dysplasia CPT copyright 2019 American Medical Association. All rights reserved. The codes documented in this report are preliminary and upon coder review may  be revised to meet current compliance requirements. Maylon Peppers, MD Maylon Peppers,  03/02/2022 9:45:32 AM This report has been signed electronically. Number of Addenda: 0

## 2022-03-02 NOTE — Interval H&P Note (Signed)
History and Physical Interval Note:  03/02/2022 8:27 AM  Shawn Bullock  has presented today for surgery, with the diagnosis of Barretts Esophagus Surveillance.  The various methods of treatment have been discussed with the patient and family. After consideration of risks, benefits and other options for treatment, the patient has consented to  Procedure(s) with comments: ESOPHAGOGASTRODUODENOSCOPY (EGD) WITH PROPOFOL (N/A) - 900 ASA 2 as a surgical intervention.  The patient's history has been reviewed, patient examined, no change in status, stable for surgery.  I have reviewed the patient's chart and labs.  Questions were answered to the patient's satisfaction.     [name]

## 2022-03-02 NOTE — Transfer of Care (Signed)
Immediate Anesthesia Transfer of Care Note  Patient: Shawn Bullock  Procedure(s) Performed: ESOPHAGOGASTRODUODENOSCOPY (EGD) WITH PROPOFOL BIOPSY  Patient Location: PACU  Anesthesia Type:General  Level of Consciousness: awake, alert  and oriented  Airway & Oxygen Therapy: Patient Spontanous Breathing  Post-op Assessment: Report given to RN, Post -op Vital signs reviewed and stable, Patient moving all extremities X 4 and Patient able to stick tongue midline  Post vital signs: Reviewed  Last Vitals:  Vitals Value Taken Time  BP 95/63 03/02/22 0943  Temp 36.3 C 03/02/22 0943  Pulse 80 03/02/22 0943  Resp 18 03/02/22 0943  SpO2 96 % 03/02/22 0943    Last Pain:  Vitals:   03/02/22 0943  TempSrc: Axillary  PainSc: 0-No pain         Complications: No notable events documented.

## 2022-03-02 NOTE — Anesthesia Preprocedure Evaluation (Signed)
Anesthesia Evaluation  Patient identified by MRN, date of birth, ID band Patient awake    Reviewed: Allergy & Precautions, H&P , NPO status , Patient's Chart, lab work & pertinent test results, reviewed documented beta blocker date and time   Airway Mallampati: II  TM Distance: >3 FB Neck ROM: full    Dental no notable dental hx.    Pulmonary neg pulmonary ROS,    Pulmonary exam normal breath sounds clear to auscultation       Cardiovascular Exercise Tolerance: Good negative cardio ROS   Rhythm:regular Rate:Normal     Neuro/Psych negative neurological ROS  negative psych ROS   GI/Hepatic Neg liver ROS, GERD  Medicated,  Endo/Other  negative endocrine ROS  Renal/GU negative Renal ROS  negative genitourinary   Musculoskeletal   Abdominal   Peds  Hematology negative hematology ROS (+)   Anesthesia Other Findings   Reproductive/Obstetrics negative OB ROS                             Anesthesia Physical Anesthesia Plan  ASA: 2  Anesthesia Plan: General   Post-op Pain Management:    Induction:   PONV Risk Score and Plan: Propofol infusion  Airway Management Planned:   Additional Equipment:   Intra-op Plan:   Post-operative Plan:   Informed Consent: I have reviewed the patients History and Physical, chart, labs and discussed the procedure including the risks, benefits and alternatives for the proposed anesthesia with the patient or authorized representative who has indicated his/her understanding and acceptance.     Dental Advisory Given  Plan Discussed with: CRNA  Anesthesia Plan Comments:         Anesthesia Quick Evaluation  

## 2022-03-03 DIAGNOSIS — Z23 Encounter for immunization: Secondary | ICD-10-CM | POA: Diagnosis not present

## 2022-03-03 LAB — SURGICAL PATHOLOGY

## 2022-03-03 NOTE — Anesthesia Postprocedure Evaluation (Signed)
Anesthesia Post Note  Patient: Shawn Bullock  Procedure(s) Performed: ESOPHAGOGASTRODUODENOSCOPY (EGD) WITH PROPOFOL BIOPSY  Patient location during evaluation: Phase II Anesthesia Type: General Level of consciousness: awake Pain management: pain level controlled Vital Signs Assessment: post-procedure vital signs reviewed and stable Respiratory status: spontaneous breathing and respiratory function stable Cardiovascular status: blood pressure returned to baseline and stable Postop Assessment: no headache and no apparent nausea or vomiting Anesthetic complications: no Comments: Late entry   No notable events documented.   Last Vitals:  Vitals:   03/02/22 0803 03/02/22 0943  BP: (!) 143/81 95/63  Pulse: 81 80  Resp: 18 18  Temp: 36.7 C (!) 36.3 C  SpO2: 100% 96%    Last Pain:  Vitals:   03/02/22 0943  TempSrc: Axillary  PainSc: 0-No pain                 Louann Sjogren

## 2022-03-07 ENCOUNTER — Encounter (HOSPITAL_COMMUNITY): Payer: Self-pay | Admitting: Gastroenterology

## 2022-03-24 DIAGNOSIS — L814 Other melanin hyperpigmentation: Secondary | ICD-10-CM | POA: Diagnosis not present

## 2022-03-24 DIAGNOSIS — L570 Actinic keratosis: Secondary | ICD-10-CM | POA: Diagnosis not present

## 2022-03-24 DIAGNOSIS — L578 Other skin changes due to chronic exposure to nonionizing radiation: Secondary | ICD-10-CM | POA: Diagnosis not present

## 2022-03-24 DIAGNOSIS — L821 Other seborrheic keratosis: Secondary | ICD-10-CM | POA: Diagnosis not present

## 2022-03-24 DIAGNOSIS — Z85828 Personal history of other malignant neoplasm of skin: Secondary | ICD-10-CM | POA: Diagnosis not present

## 2022-03-24 DIAGNOSIS — L209 Atopic dermatitis, unspecified: Secondary | ICD-10-CM | POA: Diagnosis not present

## 2022-03-27 ENCOUNTER — Encounter (INDEPENDENT_AMBULATORY_CARE_PROVIDER_SITE_OTHER): Payer: Self-pay | Admitting: Gastroenterology

## 2022-03-29 ENCOUNTER — Ambulatory Visit: Payer: Medicare Other | Admitting: Dermatology

## 2022-03-30 ENCOUNTER — Telehealth: Payer: Self-pay | Admitting: Gastroenterology

## 2022-03-30 ENCOUNTER — Telehealth (INDEPENDENT_AMBULATORY_CARE_PROVIDER_SITE_OTHER): Payer: Self-pay | Admitting: *Deleted

## 2022-03-30 NOTE — Telephone Encounter (Signed)
Supervising MD 03/30/2022 PM  Hi Dr.Nandigam,   Patient called states he would like to transfer is care over to our office due to his previous GI doctor from [place] retired. Patient states he was assigned to another Provider there but he is not please with him. Records are in Epic and path for review. Please advise on scheduling.    Thank you

## 2022-03-30 NOTE — Telephone Encounter (Signed)
Thanks for the update

## 2022-03-30 NOTE — Telephone Encounter (Signed)
FYI: patient called in wanting to transfer GI care to Dr Renford Dills at [facility]

## 2022-03-31 NOTE — Telephone Encounter (Signed)
Ok, please let patient know that my next available appointment is several months away and if he is okay with that please schedule office visit.  Thanks

## 2022-03-31 NOTE — Telephone Encounter (Signed)
I call patient to schedule, but patient did not pick up. I left patient a detailed message for patient to call back to schedule.

## 2022-05-04 ENCOUNTER — Encounter (INDEPENDENT_AMBULATORY_CARE_PROVIDER_SITE_OTHER): Payer: Self-pay | Admitting: *Deleted

## 2022-05-10 ENCOUNTER — Telehealth: Payer: Self-pay | Admitting: Gastroenterology

## 2022-05-10 NOTE — Telephone Encounter (Addendum)
Hi Dr Silverio Decamp,   I have called patient multiple times to schedule him for an office visit, but I was unable to speak with patient. I left patient voice message to call back to schedule.

## 2022-05-10 NOTE — Telephone Encounter (Signed)
ok 

## 2022-06-10 DIAGNOSIS — M7712 Lateral epicondylitis, left elbow: Secondary | ICD-10-CM | POA: Diagnosis not present

## 2022-06-21 DIAGNOSIS — M7552 Bursitis of left shoulder: Secondary | ICD-10-CM | POA: Diagnosis not present

## 2022-06-21 DIAGNOSIS — M7551 Bursitis of right shoulder: Secondary | ICD-10-CM | POA: Diagnosis not present

## 2022-06-21 DIAGNOSIS — M7702 Medial epicondylitis, left elbow: Secondary | ICD-10-CM | POA: Diagnosis not present

## 2022-06-21 DIAGNOSIS — M25522 Pain in left elbow: Secondary | ICD-10-CM | POA: Diagnosis not present

## 2022-07-06 DIAGNOSIS — K2270 Barrett's esophagus without dysplasia: Secondary | ICD-10-CM | POA: Diagnosis not present

## 2022-07-06 DIAGNOSIS — K2100 Gastro-esophageal reflux disease with esophagitis, without bleeding: Secondary | ICD-10-CM | POA: Diagnosis not present

## 2022-07-06 DIAGNOSIS — K449 Diaphragmatic hernia without obstruction or gangrene: Secondary | ICD-10-CM | POA: Diagnosis not present

## 2022-07-06 DIAGNOSIS — K3189 Other diseases of stomach and duodenum: Secondary | ICD-10-CM | POA: Diagnosis not present

## 2022-07-06 DIAGNOSIS — K2980 Duodenitis without bleeding: Secondary | ICD-10-CM | POA: Diagnosis not present

## 2022-12-07 DIAGNOSIS — Z6824 Body mass index (BMI) 24.0-24.9, adult: Secondary | ICD-10-CM | POA: Diagnosis not present

## 2022-12-07 DIAGNOSIS — J309 Allergic rhinitis, unspecified: Secondary | ICD-10-CM | POA: Diagnosis not present

## 2022-12-07 DIAGNOSIS — R030 Elevated blood-pressure reading, without diagnosis of hypertension: Secondary | ICD-10-CM | POA: Diagnosis not present

## 2022-12-07 DIAGNOSIS — R059 Cough, unspecified: Secondary | ICD-10-CM | POA: Diagnosis not present

## 2023-01-23 DIAGNOSIS — H25813 Combined forms of age-related cataract, bilateral: Secondary | ICD-10-CM | POA: Diagnosis not present

## 2023-01-23 DIAGNOSIS — H40053 Ocular hypertension, bilateral: Secondary | ICD-10-CM | POA: Diagnosis not present

## 2023-01-26 DIAGNOSIS — R7301 Impaired fasting glucose: Secondary | ICD-10-CM | POA: Diagnosis not present

## 2023-01-26 DIAGNOSIS — Z1329 Encounter for screening for other suspected endocrine disorder: Secondary | ICD-10-CM | POA: Diagnosis not present

## 2023-01-26 DIAGNOSIS — E7849 Other hyperlipidemia: Secondary | ICD-10-CM | POA: Diagnosis not present

## 2023-01-26 DIAGNOSIS — K2100 Gastro-esophageal reflux disease with esophagitis, without bleeding: Secondary | ICD-10-CM | POA: Diagnosis not present

## 2023-01-26 DIAGNOSIS — Z0000 Encounter for general adult medical examination without abnormal findings: Secondary | ICD-10-CM | POA: Diagnosis not present

## 2023-01-26 DIAGNOSIS — R5382 Chronic fatigue, unspecified: Secondary | ICD-10-CM | POA: Diagnosis not present

## 2023-02-01 DIAGNOSIS — Z23 Encounter for immunization: Secondary | ICD-10-CM | POA: Diagnosis not present

## 2023-02-01 DIAGNOSIS — Z1331 Encounter for screening for depression: Secondary | ICD-10-CM | POA: Diagnosis not present

## 2023-02-01 DIAGNOSIS — Z1389 Encounter for screening for other disorder: Secondary | ICD-10-CM | POA: Diagnosis not present

## 2023-02-01 DIAGNOSIS — K219 Gastro-esophageal reflux disease without esophagitis: Secondary | ICD-10-CM | POA: Diagnosis not present

## 2023-02-01 DIAGNOSIS — Z6824 Body mass index (BMI) 24.0-24.9, adult: Secondary | ICD-10-CM | POA: Diagnosis not present

## 2023-02-01 DIAGNOSIS — E7849 Other hyperlipidemia: Secondary | ICD-10-CM | POA: Diagnosis not present

## 2023-02-01 DIAGNOSIS — Z0001 Encounter for general adult medical examination with abnormal findings: Secondary | ICD-10-CM | POA: Diagnosis not present

## 2023-02-01 DIAGNOSIS — R7301 Impaired fasting glucose: Secondary | ICD-10-CM | POA: Diagnosis not present

## 2023-02-01 DIAGNOSIS — F411 Generalized anxiety disorder: Secondary | ICD-10-CM | POA: Diagnosis not present

## 2023-02-01 DIAGNOSIS — R030 Elevated blood-pressure reading, without diagnosis of hypertension: Secondary | ICD-10-CM | POA: Diagnosis not present

## 2023-02-14 DIAGNOSIS — R030 Elevated blood-pressure reading, without diagnosis of hypertension: Secondary | ICD-10-CM | POA: Diagnosis not present

## 2023-02-14 DIAGNOSIS — I6529 Occlusion and stenosis of unspecified carotid artery: Secondary | ICD-10-CM | POA: Diagnosis not present

## 2023-02-14 DIAGNOSIS — Z6824 Body mass index (BMI) 24.0-24.9, adult: Secondary | ICD-10-CM | POA: Diagnosis not present

## 2023-02-20 ENCOUNTER — Ambulatory Visit (INDEPENDENT_AMBULATORY_CARE_PROVIDER_SITE_OTHER): Payer: Medicare Other | Admitting: Gastroenterology

## 2023-02-24 DIAGNOSIS — I6523 Occlusion and stenosis of bilateral carotid arteries: Secondary | ICD-10-CM | POA: Diagnosis not present

## 2023-02-24 DIAGNOSIS — I6529 Occlusion and stenosis of unspecified carotid artery: Secondary | ICD-10-CM | POA: Diagnosis not present

## 2023-03-14 DIAGNOSIS — I6523 Occlusion and stenosis of bilateral carotid arteries: Secondary | ICD-10-CM | POA: Diagnosis not present

## 2023-05-15 DIAGNOSIS — L578 Other skin changes due to chronic exposure to nonionizing radiation: Secondary | ICD-10-CM | POA: Diagnosis not present

## 2023-05-15 DIAGNOSIS — L814 Other melanin hyperpigmentation: Secondary | ICD-10-CM | POA: Diagnosis not present

## 2023-05-15 DIAGNOSIS — L570 Actinic keratosis: Secondary | ICD-10-CM | POA: Diagnosis not present

## 2023-05-15 DIAGNOSIS — L821 Other seborrheic keratosis: Secondary | ICD-10-CM | POA: Diagnosis not present

## 2023-05-15 DIAGNOSIS — D485 Neoplasm of uncertain behavior of skin: Secondary | ICD-10-CM | POA: Diagnosis not present

## 2023-05-15 DIAGNOSIS — Z85828 Personal history of other malignant neoplasm of skin: Secondary | ICD-10-CM | POA: Diagnosis not present

## 2023-06-27 DIAGNOSIS — Z85828 Personal history of other malignant neoplasm of skin: Secondary | ICD-10-CM | POA: Diagnosis not present

## 2023-06-27 DIAGNOSIS — Z79899 Other long term (current) drug therapy: Secondary | ICD-10-CM | POA: Diagnosis not present

## 2023-06-27 DIAGNOSIS — Z1211 Encounter for screening for malignant neoplasm of colon: Secondary | ICD-10-CM | POA: Diagnosis not present

## 2023-06-27 DIAGNOSIS — Z8711 Personal history of peptic ulcer disease: Secondary | ICD-10-CM | POA: Diagnosis not present

## 2023-06-27 DIAGNOSIS — E785 Hyperlipidemia, unspecified: Secondary | ICD-10-CM | POA: Diagnosis not present

## 2023-06-27 DIAGNOSIS — K219 Gastro-esophageal reflux disease without esophagitis: Secondary | ICD-10-CM | POA: Diagnosis not present

## 2023-06-27 DIAGNOSIS — Z860100 Personal history of colon polyps, unspecified: Secondary | ICD-10-CM | POA: Diagnosis not present

## 2024-01-30 DIAGNOSIS — R7301 Impaired fasting glucose: Secondary | ICD-10-CM | POA: Diagnosis not present

## 2024-01-30 DIAGNOSIS — Z0001 Encounter for general adult medical examination with abnormal findings: Secondary | ICD-10-CM | POA: Diagnosis not present

## 2024-01-30 DIAGNOSIS — E7849 Other hyperlipidemia: Secondary | ICD-10-CM | POA: Diagnosis not present

## 2024-01-30 DIAGNOSIS — R5382 Chronic fatigue, unspecified: Secondary | ICD-10-CM | POA: Diagnosis not present

## 2024-02-01 DIAGNOSIS — H25813 Combined forms of age-related cataract, bilateral: Secondary | ICD-10-CM | POA: Diagnosis not present

## 2024-02-01 DIAGNOSIS — H40053 Ocular hypertension, bilateral: Secondary | ICD-10-CM | POA: Diagnosis not present

## 2024-02-02 DIAGNOSIS — Z0001 Encounter for general adult medical examination with abnormal findings: Secondary | ICD-10-CM | POA: Diagnosis not present

## 2024-02-02 DIAGNOSIS — Z1331 Encounter for screening for depression: Secondary | ICD-10-CM | POA: Diagnosis not present

## 2024-02-02 DIAGNOSIS — Z1389 Encounter for screening for other disorder: Secondary | ICD-10-CM | POA: Diagnosis not present

## 2024-02-02 DIAGNOSIS — Z0000 Encounter for general adult medical examination without abnormal findings: Secondary | ICD-10-CM | POA: Diagnosis not present

## 2024-02-02 DIAGNOSIS — Z6824 Body mass index (BMI) 24.0-24.9, adult: Secondary | ICD-10-CM | POA: Diagnosis not present

## 2024-02-06 DIAGNOSIS — Z6825 Body mass index (BMI) 25.0-25.9, adult: Secondary | ICD-10-CM | POA: Diagnosis not present

## 2024-02-06 DIAGNOSIS — N401 Enlarged prostate with lower urinary tract symptoms: Secondary | ICD-10-CM | POA: Diagnosis not present

## 2024-02-06 DIAGNOSIS — F411 Generalized anxiety disorder: Secondary | ICD-10-CM | POA: Diagnosis not present

## 2024-02-06 DIAGNOSIS — K2100 Gastro-esophageal reflux disease with esophagitis, without bleeding: Secondary | ICD-10-CM | POA: Diagnosis not present

## 2024-02-06 DIAGNOSIS — E782 Mixed hyperlipidemia: Secondary | ICD-10-CM | POA: Diagnosis not present

## 2024-02-06 DIAGNOSIS — Z23 Encounter for immunization: Secondary | ICD-10-CM | POA: Diagnosis not present

## 2024-02-28 ENCOUNTER — Encounter (INDEPENDENT_AMBULATORY_CARE_PROVIDER_SITE_OTHER): Payer: Self-pay | Admitting: Gastroenterology

## 2024-03-12 DIAGNOSIS — E785 Hyperlipidemia, unspecified: Secondary | ICD-10-CM | POA: Diagnosis not present

## 2024-03-12 DIAGNOSIS — I6523 Occlusion and stenosis of bilateral carotid arteries: Secondary | ICD-10-CM | POA: Diagnosis not present
